# Patient Record
Sex: Female | Born: 1995 | Race: Black or African American | Hispanic: No | Marital: Single | Smoking: Never smoker
Health system: Southern US, Community
[De-identification: ages and names within clinical notes are randomized; demographics above are authoritative.]

## PROBLEM LIST (undated history)

## (undated) ENCOUNTER — Inpatient Hospital Stay (HOSPITAL_COMMUNITY): Payer: Self-pay

## (undated) DIAGNOSIS — O98813 Other maternal infectious and parasitic diseases complicating pregnancy, third trimester: Secondary | ICD-10-CM

## (undated) DIAGNOSIS — K2970 Gastritis, unspecified, without bleeding: Secondary | ICD-10-CM

## (undated) DIAGNOSIS — O24419 Gestational diabetes mellitus in pregnancy, unspecified control: Secondary | ICD-10-CM

## (undated) DIAGNOSIS — A749 Chlamydial infection, unspecified: Secondary | ICD-10-CM

## (undated) HISTORY — DX: Gestational diabetes mellitus in pregnancy, unspecified control: O24.419

## (undated) HISTORY — PX: HERNIA REPAIR: SHX51

---
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
## 2016-02-11 LAB — OB RESULTS CONSOLE GC/CHLAMYDIA: CHLAMYDIA, DNA PROBE: POSITIVE

## 2016-02-19 ENCOUNTER — Other Ambulatory Visit (HOSPITAL_COMMUNITY): Payer: Self-pay | Admitting: Obstetrics and Gynecology

## 2016-02-19 DIAGNOSIS — Z3A38 38 weeks gestation of pregnancy: Secondary | ICD-10-CM

## 2016-02-19 DIAGNOSIS — Z3689 Encounter for other specified antenatal screening: Secondary | ICD-10-CM

## 2016-02-24 ENCOUNTER — Ambulatory Visit (HOSPITAL_COMMUNITY)
Admission: RE | Admit: 2016-02-24 | Discharge: 2016-02-24 | Disposition: A | Discharge status: Home / Self Care | Payer: 59 | Source: Ambulatory Visit | Attending: Obstetrics and Gynecology | Admitting: Obstetrics and Gynecology

## 2016-02-24 DIAGNOSIS — Z3A38 38 weeks gestation of pregnancy: Secondary | ICD-10-CM | POA: Insufficient documentation

## 2016-02-24 DIAGNOSIS — Z3689 Encounter for other specified antenatal screening: Secondary | ICD-10-CM

## 2016-02-24 DIAGNOSIS — O26843 Uterine size-date discrepancy, third trimester: Secondary | ICD-10-CM | POA: Insufficient documentation

## 2016-02-29 NOTE — L&D Delivery Note (Signed)
Delivery Note Pt reached compete dilation and pushed well for about 15 minutes. At 6:34 PM a healthy female was delivered via Vaginal, Spontaneous Delivery (Presentation: LOA  ).  APGAR: 9, 9; weight pending.   Placenta status: delivered spontaneously .  Cord:  with the following complications: none .    Anesthesia: Epidural  Episiotomy: None Lacerations: 1st degree repaired for hemostasis and small periurethral Suture Repair: 3.0 vicryl rapide Est. Blood Loss (mL): 175mL  Mom to postpartum.  Baby to Couplet care / Skin to Skin.  Urine sent for GC/Chlam D/w pt, FOB and pt's mother circumcision.  The patient states she has not yet paid for the procedure.  We discussed office circumcision and hospital circumcision and they will likely proceed in office.  Aware payment must be made at either place before we can proceed.  Oliver PilaRICHARDSON,Ingra Rother W 03/03/2016, 6:58 PM

## 2016-03-03 ENCOUNTER — Inpatient Hospital Stay (HOSPITAL_COMMUNITY)
Admission: AD | Admit: 2016-03-03 | Discharge: 2016-03-05 | DRG: 775 | Disposition: A | Discharge status: Home / Self Care | Payer: 59 | Source: Ambulatory Visit | Attending: Obstetrics and Gynecology | Admitting: Obstetrics and Gynecology

## 2016-03-03 ENCOUNTER — Inpatient Hospital Stay (HOSPITAL_COMMUNITY): Payer: 59 | Admitting: Anesthesiology

## 2016-03-03 ENCOUNTER — Encounter (HOSPITAL_COMMUNITY): Payer: Self-pay

## 2016-03-03 DIAGNOSIS — Z3A39 39 weeks gestation of pregnancy: Secondary | ICD-10-CM

## 2016-03-03 DIAGNOSIS — O99824 Streptococcus B carrier state complicating childbirth: Secondary | ICD-10-CM | POA: Diagnosis present

## 2016-03-03 DIAGNOSIS — Z3493 Encounter for supervision of normal pregnancy, unspecified, third trimester: Secondary | ICD-10-CM | POA: Diagnosis present

## 2016-03-03 HISTORY — DX: Other maternal infectious and parasitic diseases complicating pregnancy, third trimester: O98.813

## 2016-03-03 HISTORY — DX: Chlamydial infection, unspecified: A74.9

## 2016-03-03 LAB — CBC
HEMATOCRIT: 36.9 % (ref 36.0–46.0)
HEMOGLOBIN: 13.1 g/dL (ref 12.0–15.0)
MCH: 29.7 pg (ref 26.0–34.0)
MCHC: 35.5 g/dL (ref 30.0–36.0)
MCV: 83.7 fL (ref 78.0–100.0)
Platelets: 178 10*3/uL (ref 150–400)
RBC: 4.41 MIL/uL (ref 3.87–5.11)
RDW: 13.4 % (ref 11.5–15.5)
WBC: 14.2 10*3/uL — ABNORMAL HIGH (ref 4.0–10.5)

## 2016-03-03 LAB — OB RESULTS CONSOLE ANTIBODY SCREEN: ANTIBODY SCREEN: NEGATIVE

## 2016-03-03 LAB — OB RESULTS CONSOLE RPR: RPR: NONREACTIVE

## 2016-03-03 LAB — OB RESULTS CONSOLE HEPATITIS B SURFACE ANTIGEN: Hepatitis B Surface Ag: NEGATIVE

## 2016-03-03 LAB — OB RESULTS CONSOLE RUBELLA ANTIBODY, IGM: Rubella: IMMUNE

## 2016-03-03 LAB — OB RESULTS CONSOLE GC/CHLAMYDIA
Chlamydia: NEGATIVE
Gonorrhea: NEGATIVE

## 2016-03-03 LAB — OB RESULTS CONSOLE GBS: STREP GROUP B AG: POSITIVE

## 2016-03-03 LAB — OB RESULTS CONSOLE ABO/RH: RH Type: POSITIVE

## 2016-03-03 LAB — TYPE AND SCREEN
ABO/RH(D): O POS
Antibody Screen: NEGATIVE

## 2016-03-03 LAB — OB RESULTS CONSOLE HIV ANTIBODY (ROUTINE TESTING): HIV: NONREACTIVE

## 2016-03-03 LAB — ABO/RH: ABO/RH(D): O POS

## 2016-03-03 MED ORDER — DIBUCAINE 1 % RE OINT: 1.0000 "application " | TOPICAL_OINTMENT | RECTAL | Status: DC | PRN

## 2016-03-03 MED ORDER — SODIUM CHLORIDE 0.9 % IV SOLN
2.0000 g | Freq: Once | INTRAVENOUS | Status: AC
  Administered 2016-03-03: 2 g via INTRAVENOUS
  Filled 2016-03-03: qty 2000

## 2016-03-03 MED ORDER — BENZOCAINE-MENTHOL 20-0.5 % EX AERO
1.0000 "application " | INHALATION_SPRAY | CUTANEOUS | Status: DC | PRN
  Administered 2016-03-04: 1 via TOPICAL
  Filled 2016-03-03: qty 56

## 2016-03-03 MED ORDER — OXYTOCIN BOLUS FROM INFUSION
500.0000 mL | Freq: Once | INTRAVENOUS | Status: AC
  Administered 2016-03-03: 500 mL via INTRAVENOUS

## 2016-03-03 MED ORDER — SOD CITRATE-CITRIC ACID 500-334 MG/5ML PO SOLN: 30.0000 mL | ORAL | Status: DC | PRN

## 2016-03-03 MED ORDER — LIDOCAINE HCL (PF) 1 % IJ SOLN
30.0000 mL | INTRAMUSCULAR | Status: DC | PRN
  Filled 2016-03-03: qty 30

## 2016-03-03 MED ORDER — IBUPROFEN 600 MG PO TABS
600.0000 mg | ORAL_TABLET | Freq: Four times a day (QID) | ORAL | Status: DC
  Administered 2016-03-04 – 2016-03-05 (×7): 600 mg via ORAL
  Filled 2016-03-03 (×7): qty 1

## 2016-03-03 MED ORDER — PHENYLEPHRINE 40 MCG/ML (10ML) SYRINGE FOR IV PUSH (FOR BLOOD PRESSURE SUPPORT): 80.0000 ug | PREFILLED_SYRINGE | INTRAVENOUS | Status: DC | PRN

## 2016-03-03 MED ORDER — EPHEDRINE 5 MG/ML INJ
10.0000 mg | INTRAVENOUS | Status: DC | PRN
  Filled 2016-03-03: qty 4

## 2016-03-03 MED ORDER — LACTATED RINGERS IV SOLN: 500.0000 mL | Freq: Once | INTRAVENOUS | Status: DC

## 2016-03-03 MED ORDER — PHENYLEPHRINE 40 MCG/ML (10ML) SYRINGE FOR IV PUSH (FOR BLOOD PRESSURE SUPPORT)
80.0000 ug | PREFILLED_SYRINGE | INTRAVENOUS | Status: DC | PRN
  Filled 2016-03-03: qty 5

## 2016-03-03 MED ORDER — FENTANYL 2.5 MCG/ML BUPIVACAINE 1/10 % EPIDURAL INFUSION (WH - ANES)
14.0000 mL/h | INTRAMUSCULAR | Status: DC | PRN
  Administered 2016-03-03: 14 mL/h via EPIDURAL

## 2016-03-03 MED ORDER — ZOLPIDEM TARTRATE 5 MG PO TABS: 5.0000 mg | ORAL_TABLET | Freq: Every evening | ORAL | Status: DC | PRN

## 2016-03-03 MED ORDER — SIMETHICONE 80 MG PO CHEW: 80.0000 mg | CHEWABLE_TABLET | ORAL | Status: DC | PRN

## 2016-03-03 MED ORDER — ACETAMINOPHEN 325 MG PO TABS
650.0000 mg | ORAL_TABLET | ORAL | Status: DC | PRN
  Administered 2016-03-04: 650 mg via ORAL
  Filled 2016-03-03: qty 2

## 2016-03-03 MED ORDER — ONDANSETRON HCL 4 MG PO TABS: 4.0000 mg | ORAL_TABLET | ORAL | Status: DC | PRN

## 2016-03-03 MED ORDER — LACTATED RINGERS IV SOLN
500.0000 mL | Freq: Once | INTRAVENOUS | Status: AC
  Administered 2016-03-03: 500 mL via INTRAVENOUS

## 2016-03-03 MED ORDER — OXYTOCIN 40 UNITS IN LACTATED RINGERS INFUSION - SIMPLE MED
2.5000 [IU]/h | INTRAVENOUS | Status: DC
  Administered 2016-03-03: 2.5 [IU]/h via INTRAVENOUS
  Filled 2016-03-03: qty 1000

## 2016-03-03 MED ORDER — ONDANSETRON HCL 4 MG/2ML IJ SOLN: 4.0000 mg | INTRAMUSCULAR | Status: DC | PRN

## 2016-03-03 MED ORDER — DIPHENHYDRAMINE HCL 25 MG PO CAPS: 25.0000 mg | ORAL_CAPSULE | Freq: Four times a day (QID) | ORAL | Status: DC | PRN

## 2016-03-03 MED ORDER — PHENYLEPHRINE 40 MCG/ML (10ML) SYRINGE FOR IV PUSH (FOR BLOOD PRESSURE SUPPORT)
PREFILLED_SYRINGE | INTRAVENOUS | Status: DC
Start: 2016-03-03 — End: 2016-03-03
  Filled 2016-03-03: qty 20

## 2016-03-03 MED ORDER — COCONUT OIL OIL: 1.0000 "application " | TOPICAL_OIL | Status: DC | PRN

## 2016-03-03 MED ORDER — SENNOSIDES-DOCUSATE SODIUM 8.6-50 MG PO TABS
2.0000 | ORAL_TABLET | ORAL | Status: DC
  Administered 2016-03-04 – 2016-03-05 (×2): 2 via ORAL
  Filled 2016-03-03 (×2): qty 2

## 2016-03-03 MED ORDER — LACTATED RINGERS IV SOLN
INTRAVENOUS | Status: DC
  Administered 2016-03-03: 16:00:00 via INTRAVENOUS

## 2016-03-03 MED ORDER — TETANUS-DIPHTH-ACELL PERTUSSIS 5-2.5-18.5 LF-MCG/0.5 IM SUSP: 0.5000 mL | Freq: Once | INTRAMUSCULAR | Status: DC

## 2016-03-03 MED ORDER — OXYCODONE-ACETAMINOPHEN 5-325 MG PO TABS: 1.0000 | ORAL_TABLET | ORAL | Status: DC | PRN

## 2016-03-03 MED ORDER — LACTATED RINGERS IV SOLN
500.0000 mL | INTRAVENOUS | Status: DC | PRN
  Administered 2016-03-03: 500 mL via INTRAVENOUS

## 2016-03-03 MED ORDER — ACETAMINOPHEN 325 MG PO TABS: 650.0000 mg | ORAL_TABLET | ORAL | Status: DC | PRN

## 2016-03-03 MED ORDER — ONDANSETRON HCL 4 MG/2ML IJ SOLN: 4.0000 mg | Freq: Four times a day (QID) | INTRAMUSCULAR | Status: DC | PRN

## 2016-03-03 MED ORDER — LIDOCAINE HCL (PF) 1 % IJ SOLN
INTRAMUSCULAR | Status: DC | PRN
  Administered 2016-03-03: 4 mL via EPIDURAL

## 2016-03-03 MED ORDER — FLEET ENEMA 7-19 GM/118ML RE ENEM: 1.0000 | ENEMA | RECTAL | Status: DC | PRN

## 2016-03-03 MED ORDER — OXYCODONE-ACETAMINOPHEN 5-325 MG PO TABS: 2.0000 | ORAL_TABLET | ORAL | Status: DC | PRN

## 2016-03-03 MED ORDER — EPHEDRINE 5 MG/ML INJ: 10.0000 mg | INTRAVENOUS | Status: DC | PRN

## 2016-03-03 MED ORDER — DIPHENHYDRAMINE HCL 50 MG/ML IJ SOLN: 12.5000 mg | INTRAMUSCULAR | Status: DC | PRN

## 2016-03-03 MED ORDER — WITCH HAZEL-GLYCERIN EX PADS: 1.0000 "application " | MEDICATED_PAD | CUTANEOUS | Status: DC | PRN

## 2016-03-03 MED ORDER — FENTANYL 2.5 MCG/ML BUPIVACAINE 1/10 % EPIDURAL INFUSION (WH - ANES)
INTRAMUSCULAR | Status: AC
  Filled 2016-03-03: qty 100

## 2016-03-03 MED ORDER — PRENATAL MULTIVITAMIN CH
1.0000 | ORAL_TABLET | Freq: Every day | ORAL | Status: DC
  Administered 2016-03-04 – 2016-03-05 (×2): 1 via ORAL
  Filled 2016-03-03 (×2): qty 1

## 2016-03-03 NOTE — H&P (Signed)
Erica Horton is a 21 y.o. female G1P0 at 8639 5/7 weeks (EDD1/6/18) by 24 week US)  presenting for active labor at erm.  Cervix 6cm on admission, received ampicillin for +GBS and epidural and now 9+cm within 2 hours of admission. Prenatal care complicated by late start at 26 weeks, pt stated she did not know pregnant.  Also, initially chlamydia was negative 10/17 but positive 02/05/16, did not pick up script and take until 02/18/16 so has not had TOC.  States took all medication and partner did as well.   Had a fundal lag, but US at 35 weeks was 30%le and AFI WNL.    OB History    Gravida Para Term Preterm AB Living   1 0 0 0 0 0   SAB TAB Ectopic Multiple Live Births                 Past Medical History:  Diagnosis Date  . Chlamydia infection affecting pregnancy in third trimester    Past Surgical History:  Procedure Laterality Date  . HERNIA REPAIR     Family History: family history is not on file. Social History:  reports that she has never smoked. She has never used smokeless tobacco. She reports that she does not drink alcohol or use drugs.     Maternal Diabetes: No Genetic Screening: late to care Maternal Ultrasounds/Referrals: Normal Fetal Ultrasounds or other Referrals:  None Maternal Substance Abuse:  No Significant Maternal Medications:  None Significant Maternal Lab Results:  Lab values include: Group B Strep positive and chlamydia positive 02/05/16 Other Comments:  None  Review of Systems  Constitutional: Negative for fever.  Gastrointestinal: Positive for abdominal pain.   Maternal Medical History:  Reason for admission: Contractions.   Contractions: Onset was 6-12 hours ago.   Frequency: regular.   Perceived severity is strong.    Fetal activity: Perceived fetal activity is normal.    Prenatal Complications - Diabetes: none.   AROM clear Dilation: Lip/rim Effacement (%): 100 Station: 0 Exam by:: Dr. Senaida Oresichardson Blood pressure 126/70, pulse 75,  temperature 97.3 F (36.3 C), temperature source Oral, resp. rate 16, height 5\' 11"  (1.803 m), weight 86.2 kg (190 lb), SpO2 100 %. Maternal Exam:  Uterine Assessment: Contraction strength is firm.  Contraction frequency is regular.   Abdomen: Patient reports no abdominal tenderness. Fetal presentation: vertex  Introitus: Normal vulva. Normal vagina.    Physical Exam  Constitutional: She appears well-developed and well-nourished.  Cardiovascular: Normal rate and regular rhythm.   Respiratory: Effort normal.  GI: Soft.  Genitourinary: Vagina normal and uterus normal.  Neurological: She is alert.  Psychiatric: She has a normal mood and affect.    Prenatal labs: ABO, Rh: O/Positive/-- (01/04 1607) Antibody: Negative (01/04 1607) Rubella: Immune (01/04 1607) RPR: Nonreactive (01/04 1607)  HBsAg: Negative (01/04 1607)  HIV: Non-reactive (01/04 1607)  GBS: Positive (01/04 1606)  One hour GCT 84 Hgb AA  Assessment/Plan: Pt progressing rapidly in labor s/p one dose ampicillin Pt with +chlamydia 02/05/16, treated 02/18/16 and no TOC yet, will send urine for GC/Chlam and see if cleared, if positive may just have not been long enough from Rx.   Oliver PilaICHARDSON,Liani Caris W 03/03/2016, 5:35 PM

## 2016-03-03 NOTE — Anesthesia Preprocedure Evaluation (Signed)
Anesthesia Evaluation  Patient identified by MRN, date of birth, ID band Patient awake    Reviewed: Allergy & Precautions, NPO status , Patient's Chart, lab work & pertinent test results  Airway Mallampati: I  TM Distance: >3 FB     Dental  (+) Teeth Intact, Dental Advisory Given   Pulmonary neg pulmonary ROS,    breath sounds clear to auscultation       Cardiovascular negative cardio ROS   Rhythm:Regular Rate:Normal     Neuro/Psych negative neurological ROS  negative psych ROS   GI/Hepatic negative GI ROS, Neg liver ROS,   Endo/Other  negative endocrine ROS  Renal/GU negative Renal ROS  negative genitourinary   Musculoskeletal negative musculoskeletal ROS (+)   Abdominal   Peds negative pediatric ROS (+)  Hematology negative hematology ROS (+)   Anesthesia Other Findings   Reproductive/Obstetrics (+) Pregnancy                             Anesthesia Physical Anesthesia Plan  ASA: II  Anesthesia Plan: Epidural   Post-op Pain Management:    Induction:   Airway Management Planned:   Additional Equipment:   Intra-op Plan:   Post-operative Plan:   Informed Consent: I have reviewed the patients History and Physical, chart, labs and discussed the procedure including the risks, benefits and alternatives for the proposed anesthesia with the patient or authorized representative who has indicated his/her understanding and acceptance.     Plan Discussed with:   Anesthesia Plan Comments:         Anesthesia Quick Evaluation

## 2016-03-03 NOTE — Anesthesia Pain Management Evaluation Note (Signed)
  CRNA Pain Management Visit Note  Patient: Erica Horton, 21 y.o., female  "Hello I am a member of the anesthesia team at [facility]. We have an anesthesia team available at all times to provide care throughout the hospital, including epidural management and anesthesia for C-section. I don't know your plan for the delivery whether it a natural birth, water birth, IV sedation, nitrous supplementation, doula or epidural, but we want to meet your pain goals."   1.Was your pain managed to your expectations on prior hospitalizations?   No prior hospitalizations  2.What is your expectation for pain management during this hospitalization?     Epidural  3.How can we help you reach that goal?   Record the patient's initial score and the patient's pain goal.   Pain: 9  Pain Goal: 3 [facility] wants you to be able to say your pain was always managed very well.  Erica Horton,Erica Horton 03/03/2016

## 2016-03-03 NOTE — Anesthesia Procedure Notes (Signed)
Epidural Patient location during procedure: OB Start time: 03/03/2016 4:19 PM End time: 03/03/2016 4:25 PM  Staffing Anesthesiologist: Shona SimpsonHOLLIS, Maie Kesinger D Performed: anesthesiologist   Preanesthetic Checklist Completed: patient identified, site marked, surgical consent, pre-op evaluation, timeout performed, IV checked, risks and benefits discussed and monitors and equipment checked  Epidural Patient position: sitting Prep: ChloraPrep Patient monitoring: heart rate, continuous pulse ox and blood pressure Approach: midline Location: L3-L4 Injection technique: LOR saline  Needle:  Needle type: Tuohy  Needle gauge: 17 G Needle length: 9 cm Catheter type: closed end flexible Catheter size: 20 Guage Test dose: negative and 1.5% lidocaine  Assessment Events: blood not aspirated, injection not painful, no injection resistance and no paresthesia  Additional Notes LOR @ 5  Patient identified. Risks/Benefits/Options discussed with patient including but not limited to bleeding, infection, nerve damage, paralysis, failed block, incomplete pain control, headache, blood pressure changes, nausea, vomiting, reactions to medications, itching and postpartum back pain. Confirmed with bedside nurse the patient's most recent platelet count. Confirmed with patient that they are not currently taking any anticoagulation, have any bleeding history or any family history of bleeding disorders. Patient expressed understanding and wished to proceed. All questions were answered. Sterile technique was used throughout the entire procedure. Please see nursing notes for vital signs. Test dose was given through epidural catheter and negative prior to continuing to dose epidural or start infusion. Warning signs of high block given to the patient including shortness of breath, tingling/numbness in hands, complete motor block, or any concerning symptoms with instructions to call for help. Patient was given instructions on fall  risk and not to get out of bed. All questions and concerns addressed with instructions to call with any issues or inadequate analgesia.    Reason for block:procedure for pain

## 2016-03-03 NOTE — MAU Note (Signed)
Pt having ctx about 4.5 minutes apart since this morning.

## 2016-03-04 LAB — CBC
HCT: 31.7 % — ABNORMAL LOW (ref 36.0–46.0)
Hemoglobin: 11.1 g/dL — ABNORMAL LOW (ref 12.0–15.0)
MCH: 29.6 pg (ref 26.0–34.0)
MCHC: 35 g/dL (ref 30.0–36.0)
MCV: 84.5 fL (ref 78.0–100.0)
PLATELETS: 172 10*3/uL (ref 150–400)
RBC: 3.75 MIL/uL — ABNORMAL LOW (ref 3.87–5.11)
RDW: 13.4 % (ref 11.5–15.5)
WBC: 16.5 10*3/uL — ABNORMAL HIGH (ref 4.0–10.5)

## 2016-03-04 LAB — RPR
RPR Ser Ql: NONREACTIVE
RPR: NONREACTIVE

## 2016-03-04 LAB — HIV ANTIBODY (ROUTINE TESTING W REFLEX): HIV SCREEN 4TH GENERATION: NONREACTIVE

## 2016-03-04 NOTE — Anesthesia Postprocedure Evaluation (Signed)
Anesthesia Post Note  Patient: Erica Horton  Procedure(s) Performed: * No procedures listed *  Patient location during evaluation: Mother Baby Anesthesia Type: Epidural Level of consciousness: awake and alert Pain management: pain level controlled Vital Signs Assessment: post-procedure vital signs reviewed and stable Respiratory status: spontaneous breathing, nonlabored ventilation and respiratory function stable Cardiovascular status: stable Postop Assessment: no headache, no backache and epidural receding Anesthetic complications: no        Last Vitals:  Vitals:   03/04/16 0107 03/04/16 0820  BP: (!) 132/95 (!) 107/56  Pulse: 74 74  Resp: 14 16  Temp: 36.7 C 36.7 C    Last Pain:  Vitals:   03/04/16 0107  TempSrc: Oral  PainSc: 5    Pain Goal: Patients Stated Pain Goal: 4 (03/03/16 1548)               Rica RecordsICKELTON,Khush Pasion

## 2016-03-04 NOTE — Lactation Note (Signed)
This note was copied from a baby's chart. Lactation Consultation Note New mom sleeping w/baby STS w/face towards chest. Woke mom up and placed baby in crib for safety if mom wanted to sleep. Mom stated she did want to sleep. Mom stated he wants someone to hold him all the time. Educated on newborn behavior and feeding habits. Discussed STS, I&O, cluster feeding, supply and demand.  Mom has WIC and took BF classes there.  Mom demonstrated hand expression. No colostrum noted at this time. Mom has long pendulum breast w/everted nipple at the end of breast.  Mom encouraged to feed baby 8-12 times/24 hours and with feeding cues. Mom encouraged to waken baby for feeds.  Encouraged to call staff for assistance or for questions. WH/LC brochure given w/resources, support groups and LC services. Patient Name: Erica Horton Reason for consult: Initial assessment   Maternal Data Has patient been taught Hand Expression?: Yes Does the patient have breastfeeding experience prior to this delivery?: No  Feeding Feeding Type: Breast Fed Length of feed: 10 min  LATCH Score/Interventions       Type of Nipple: Everted at rest and after stimulation  Comfort (Breast/Nipple): Soft / non-tender     Intervention(s): Breastfeeding basics reviewed;Support Pillows;Position options;Skin to skin     Lactation Tools Discussed/Used WIC Program: Yes   Consult Status Consult Status: Follow-up Date: 03/04/16 (in pm) Follow-up type: In-patient    Charyl DancerCARVER, Maysel Mccolm G Horton, 3:19 AM

## 2016-03-04 NOTE — Progress Notes (Signed)
PPD #1 No problems Afeb, VSS Fundus firm, NT at U-1 Continue routine postpartum care 

## 2016-03-05 MED ORDER — IBUPROFEN 600 MG PO TABS: 600.0000 mg | ORAL_TABLET | Freq: Four times a day (QID) | ORAL | 0 refills | Status: DC

## 2016-03-05 NOTE — Discharge Summary (Signed)
    OB Discharge Summary     Patient Name: Erica Horton DOB: 03/08/1995 MRN: [id number]  Date of admission: 03/03/2016 Delivering MD: Huel CoteICHARDSON, KATHY   Date of discharge: 03/05/2016  Admitting diagnosis: 40w ctx 4 min, pressure Intrauterine pregnancy: 8671w5d     Secondary diagnosis:  Active Problems:   Indication for care or intervention related to labor and delivery   NSVD (normal spontaneous vaginal delivery)      Discharge diagnosis: Term Pregnancy Delivered                                    Hospital course:  Onset of Labor With Vaginal Delivery     21 y.o. yo G1P1001 at 4571w5d was admitted in Active Labor on 03/03/2016. Patient had an uncomplicated labor course as follows:  Membrane Rupture Time/Date: 5:22 PM ,03/03/2016   Intrapartum Procedures: Episiotomy: None [1]                                         Lacerations:  1st degree [2]  Patient had a delivery of a Viable infant. 03/03/2016  Information for the patient's newborn:  Bland SpanBowks, Boy Shenicka [[id number]][[id number]]  Delivery Method: Vag-Spont    Pateint had an uncomplicated postpartum course.  She is ambulating, tolerating a regular diet, passing flatus, and urinating well. Patient is discharged home in stable condition on 03/05/16.    Physical exam Vitals:   03/04/16 0107 03/04/16 0820 03/04/16 1733 03/05/16 0545  BP: (!) 132/95 (!) 107/56 (!) 110/50 112/61  Pulse: 74 74 79 73  Resp: 14 16 18 18   Temp: 98.1 F (36.7 C) 98.1 F (36.7 C) 97.6 F (36.4 C) 98.4 F (36.9 C)  TempSrc: Oral  Oral Oral  SpO2: 99%  99%   Weight:      Height:       General: alert Lochia: appropriate Uterine Fundus: firm  Labs: Lab Results  Component Value Date   WBC 16.5 (H) 03/04/2016   HGB 11.1 (L) 03/04/2016   HCT 31.7 (L) 03/04/2016   MCV 84.5 03/04/2016   PLT 172 03/04/2016   No flowsheet data found.  Discharge instruction: per After Visit Summary and "Baby and Me Booklet".  After visit meds:  Allergies as of 03/05/2016   No  Known Allergies     Medication List    TAKE these medications   ibuprofen 600 MG tablet Commonly known as:  ADVIL,MOTRIN Take 1 tablet (600 mg total) by mouth every 6 (six) hours. What changed:  medication strength  how much to take  when to take this  reasons to take this   prenatal multivitamin Tabs tablet Take 1 tablet by mouth daily at 12 noon.       Diet: routine diet  Activity: Advance as tolerated. Pelvic rest for 6 weeks.   Outpatient follow up:6 weeks  Newborn Data: Live born female  Birth Weight: 7 lb 8.1 oz (3405 g) APGAR: 9, 9  Baby Feeding: Breast Disposition:home with mother   03/05/2016 Zenaida NieceMEISINGER,Chavonne Sforza D, MD

## 2016-03-05 NOTE — Progress Notes (Signed)
PPD #2 Doing well Afeb, VSS D/c home 

## 2016-03-05 NOTE — Lactation Note (Signed)
This note was copied from a baby's chart. Lactation Consultation Note:   Mother states that breastfeeding is going good. She states she has a little difficulty latching infant on the left breast. Tips given to rotate positions and tickle infants top lip and bring infant on quickly.   Mother has been giving bottles. She states she is undecided how long she will breastfeed and she wants infant to be able to take a bottle.  Mother was given a harmony hand pump with instructions that she may  Pump and supplement with her own milk. Mother advised to do mostly breastfeeding now and an occational bottle.  Mother advised to feed infant 8-12 times in 24 hours and with feeding cues. Mother advised to do frequent STS and when feeding. Mother is aware of available Lactation services . Mother is active with WIC.  Patient Name: Erica Horton RUEAV'WToday's Date: 03/05/2016 Reason for consult: Follow-up assessment   Maternal Data    Feeding Feeding Type: Bottle Fed - Formula  LATCH Score/Interventions                      Lactation Tools Discussed/Used     Consult Status      Erica Horton, Erica Horton 03/05/2016, 12:50 PM

## 2016-03-05 NOTE — Discharge Instructions (Signed)
As per discharge pamphlet °

## 2016-03-07 LAB — GC/CHLAMYDIA PROBE AMP (~~LOC~~) NOT AT ARMC
CHLAMYDIA, DNA PROBE: POSITIVE — AB
Neisseria Gonorrhea: NEGATIVE

## 2016-03-11 ENCOUNTER — Inpatient Hospital Stay (HOSPITAL_COMMUNITY): Admission: RE | Admit: 2016-03-11 | Payer: 59 | Source: Ambulatory Visit

## 2016-07-03 IMAGING — CR DG KNEE COMPLETE 4+V*L*
4 series · 4 of 4 positions shown · non-contrast
Comparison: None.

CLINICAL DATA: Medial left knee pain.  No known injury.

EXAM:
LEFT KNEE - COMPLETE 4+ VIEW

[knee ap]
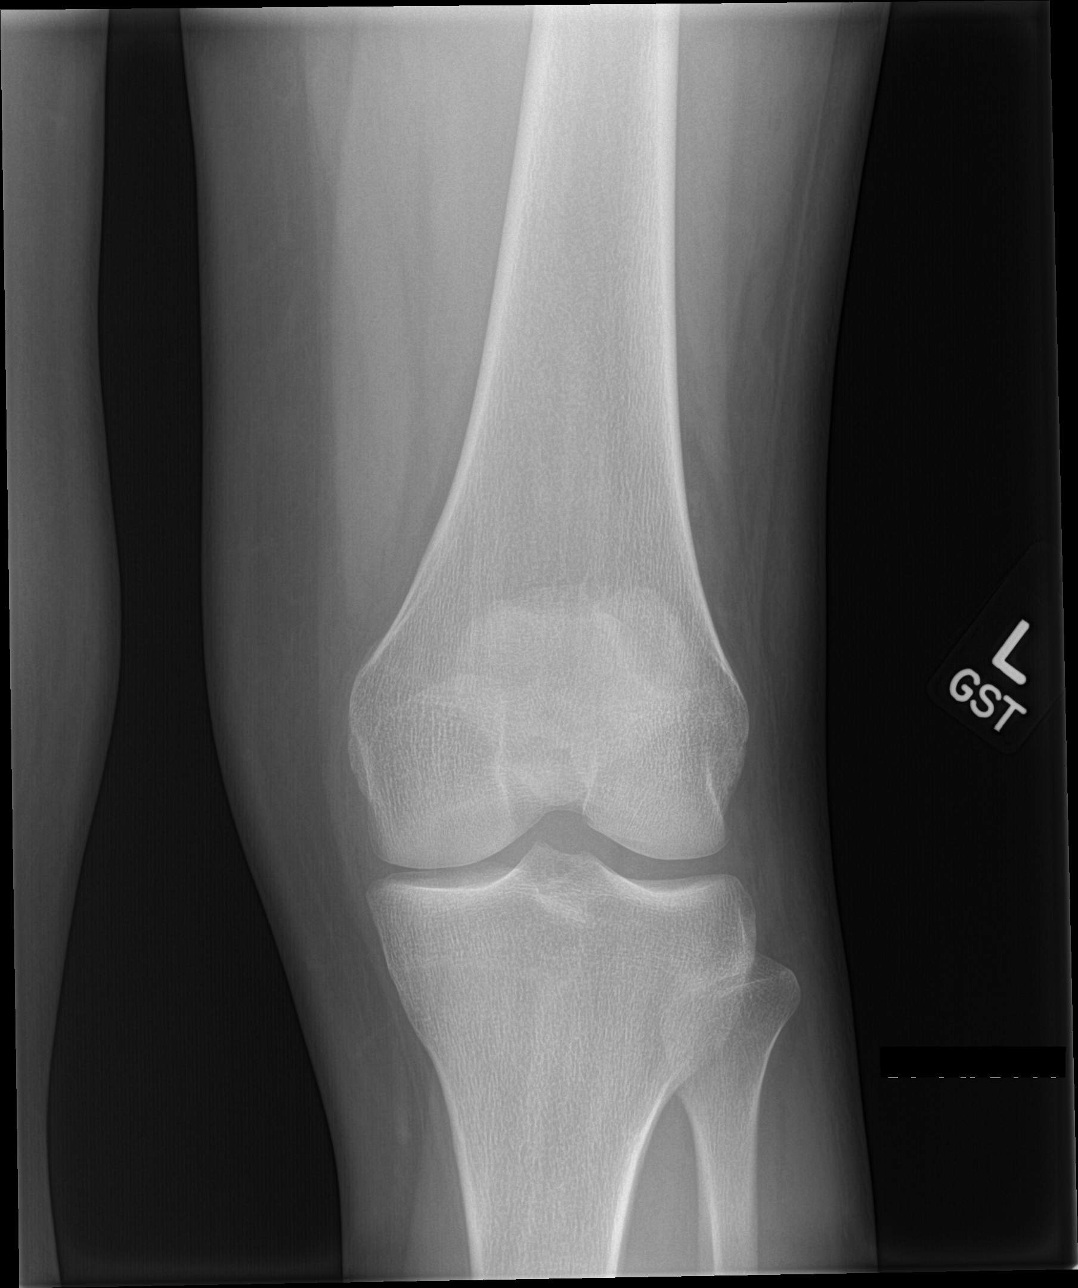

[tunnel]
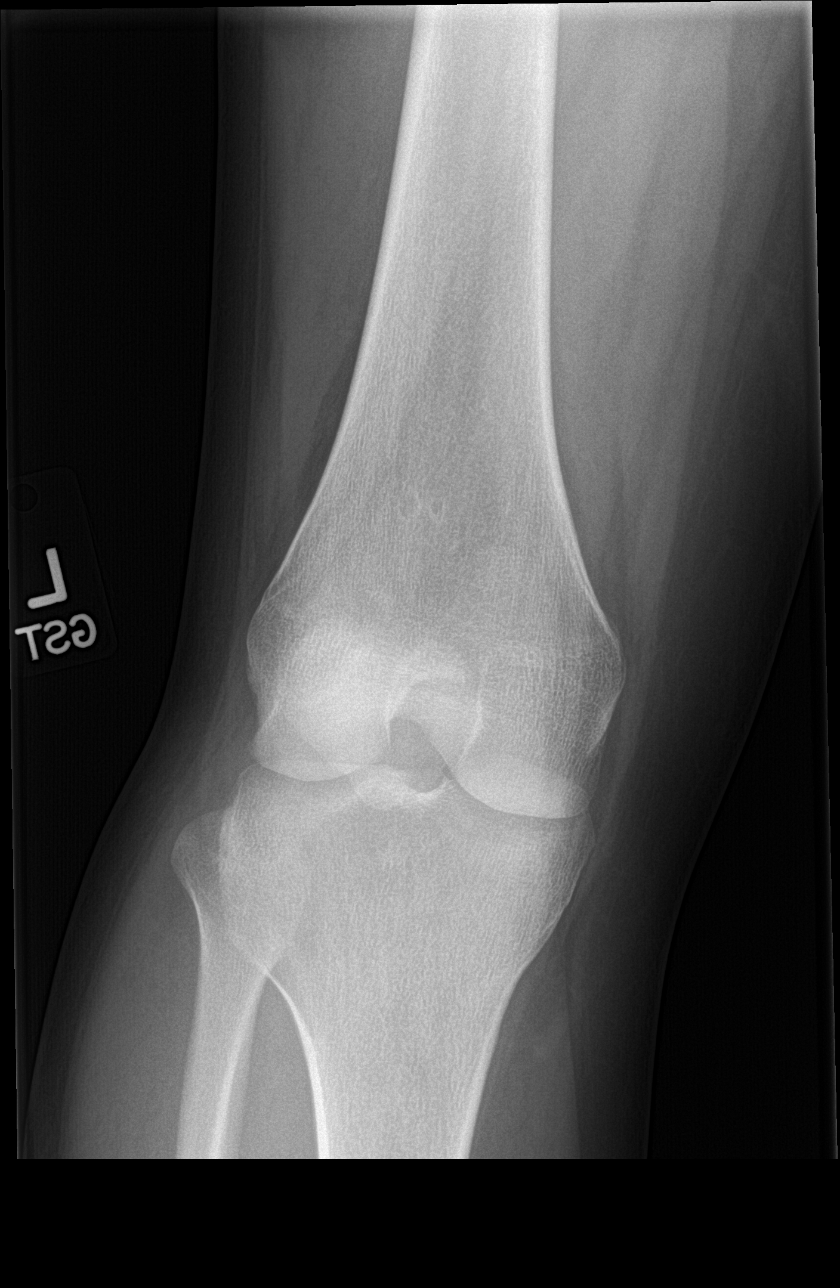

[knee lat]
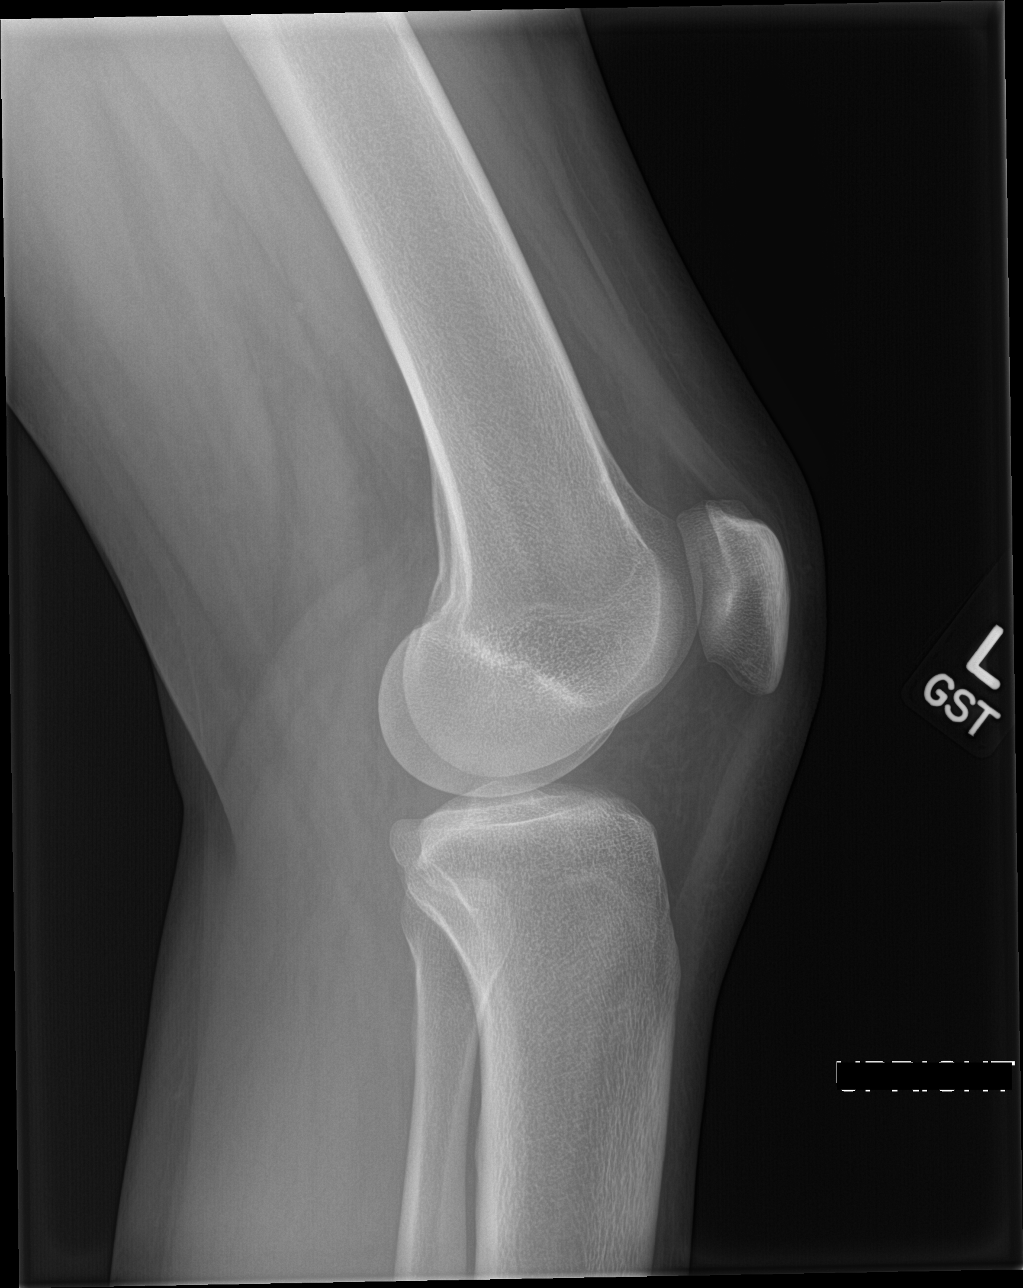

[knee sunrise]
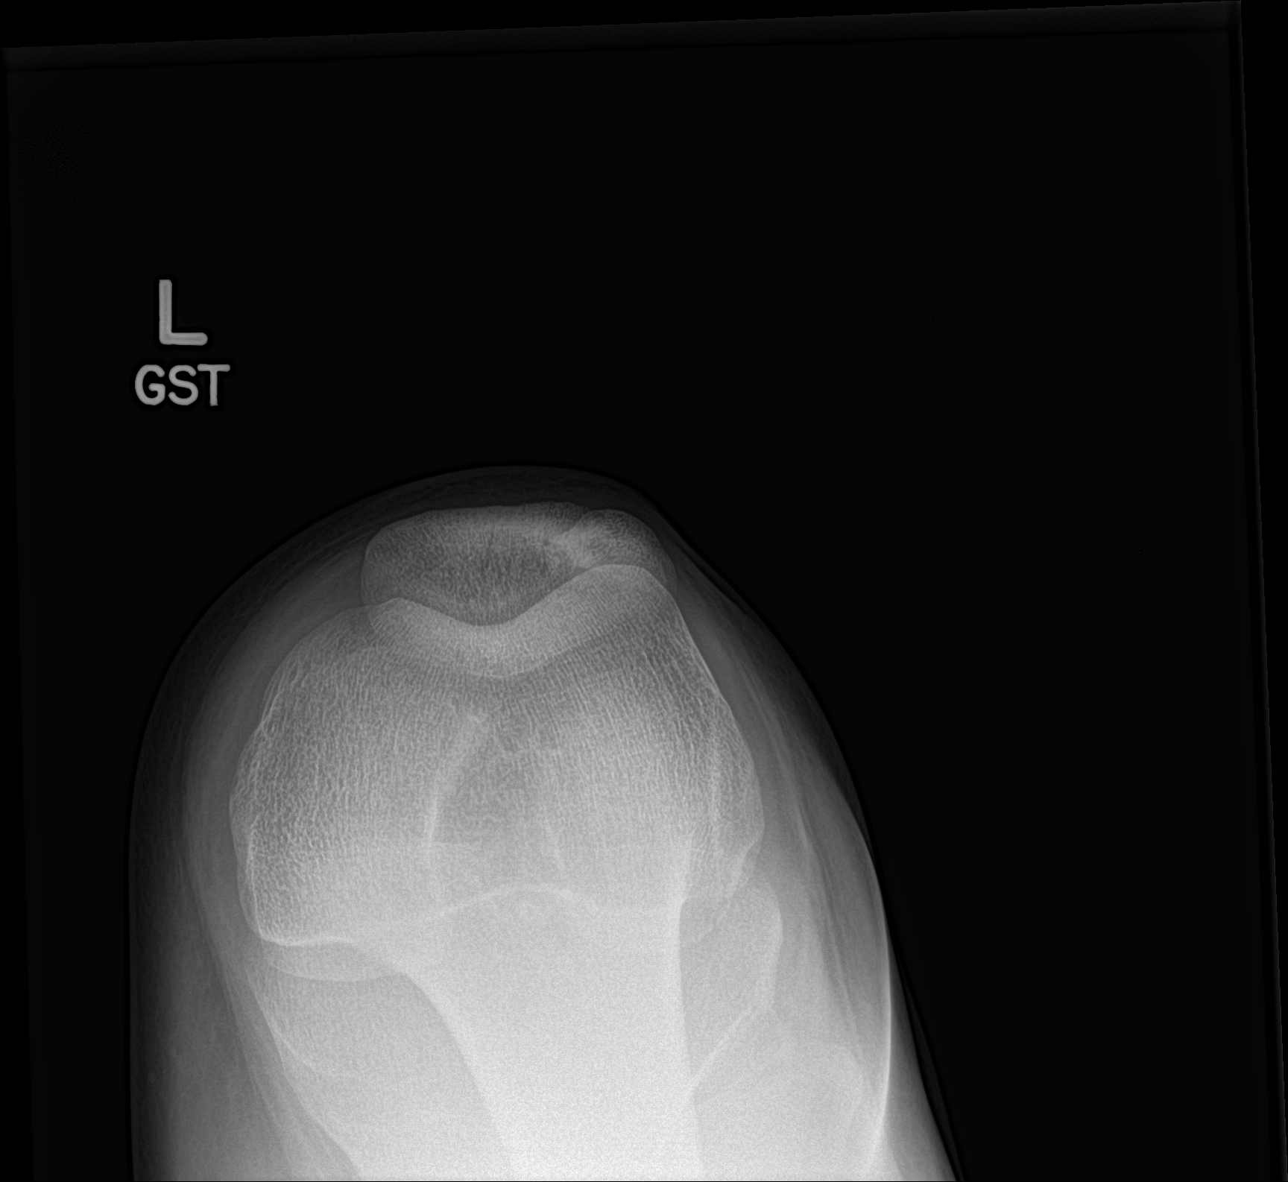

[4 of 4 positions shown; findings below may reference images not displayed]

FINDINGS: There is no evidence of fracture, dislocation, or joint effusion.
There is no evidence of arthropathy or other focal bone abnormality.
Soft tissues are unremarkable.
IMPRESSION: Negative.

## 2017-03-13 ENCOUNTER — Encounter (HOSPITAL_COMMUNITY): Payer: Self-pay

## 2017-03-13 ENCOUNTER — Other Ambulatory Visit: Payer: Self-pay

## 2017-03-13 ENCOUNTER — Emergency Department (HOSPITAL_COMMUNITY)
Admission: EM | Admit: 2017-03-13 | Discharge: 2017-03-13 | Disposition: A | Discharge status: Home / Self Care | Payer: 59 | Attending: Emergency Medicine | Admitting: Emergency Medicine

## 2017-03-13 ENCOUNTER — Emergency Department (HOSPITAL_COMMUNITY): Payer: 59

## 2017-03-13 DIAGNOSIS — Y999 Unspecified external cause status: Secondary | ICD-10-CM | POA: Diagnosis not present

## 2017-03-13 DIAGNOSIS — Y939 Activity, unspecified: Secondary | ICD-10-CM | POA: Insufficient documentation

## 2017-03-13 DIAGNOSIS — M25521 Pain in right elbow: Secondary | ICD-10-CM

## 2017-03-13 DIAGNOSIS — S50311A Abrasion of right elbow, initial encounter: Secondary | ICD-10-CM | POA: Insufficient documentation

## 2017-03-13 DIAGNOSIS — T148XXA Other injury of unspecified body region, initial encounter: Secondary | ICD-10-CM

## 2017-03-13 DIAGNOSIS — Y929 Unspecified place or not applicable: Secondary | ICD-10-CM | POA: Diagnosis not present

## 2017-03-13 DIAGNOSIS — Z79899 Other long term (current) drug therapy: Secondary | ICD-10-CM | POA: Diagnosis not present

## 2017-03-13 MED ORDER — NAPROXEN 500 MG PO TABS: 500.0000 mg | ORAL_TABLET | Freq: Two times a day (BID) | ORAL | 0 refills | Status: DC | PRN

## 2017-03-13 MED ORDER — BACITRACIN ZINC 500 UNIT/GM EX OINT
TOPICAL_OINTMENT | Freq: Once | CUTANEOUS | Status: AC
  Administered 2017-03-13: 23:00:00 via TOPICAL

## 2017-03-13 NOTE — ED Notes (Signed)
Patient Alert and oriented X4. Stable and ambulatory. Patient verbalized understanding of the discharge instructions.  Patient belongings were taken by the patient.  
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
## 2017-10-30 ENCOUNTER — Ambulatory Visit (HOSPITAL_COMMUNITY)
Admission: EM | Admit: 2017-10-30 | Discharge: 2017-10-30 | Disposition: A | Discharge status: Home / Self Care | Payer: 59 | Attending: Family Medicine | Admitting: Family Medicine

## 2017-10-30 ENCOUNTER — Encounter (HOSPITAL_COMMUNITY): Payer: Self-pay

## 2017-10-30 DIAGNOSIS — J069 Acute upper respiratory infection, unspecified: Secondary | ICD-10-CM | POA: Diagnosis not present

## 2017-10-30 DIAGNOSIS — B9789 Other viral agents as the cause of diseases classified elsewhere: Secondary | ICD-10-CM

## 2017-10-30 MED ORDER — CETIRIZINE HCL 5 MG PO TABS: 5.0000 mg | ORAL_TABLET | Freq: Every day | ORAL | 1 refills | Status: DC

## 2017-10-30 NOTE — Discharge Instructions (Signed)
It was nice meeting you!!  I believe this could be allergy related I will try Zyrtec over-the-counter to see if this helps her symptoms He can also continue the cough medication as needed. Follow up as needed for continued or worsening symptoms

## 2017-10-30 NOTE — ED Triage Notes (Signed)
Pt presents with persistent cough with no relief.

## 2017-10-30 NOTE — ED Provider Notes (Addendum)
[facility]    CSN: [id number] Arrival date & time: 10/30/17  7741     History   Chief Complaint Chief Complaint  Patient presents with  . Cough    HPI Erica Horton is a 22 y.o. female.   Is a healthy 22 year old female that presents with 2 days of mild cough, sore throat, itchy watery eyes, congestion.  Her symptoms have been constant but remain the same.  She has been taking over-the-counter meds for her symptoms.  She denies any fever, chills, body aches, fatigue, night sweats.  She denies any nausea, vomiting, diarrhea.  She denies any history of asthma or allergies.  She does not smoke     Past Medical History:  Diagnosis Date  . Chlamydia infection affecting pregnancy in third trimester     Patient Active Problem List   Diagnosis Date Noted  . Indication for care or intervention related to labor and delivery 03/03/2016  . NSVD (normal spontaneous vaginal delivery) 03/03/2016    Past Surgical History:  Procedure Laterality Date  . HERNIA REPAIR      OB History    Gravida  1   Para  1   Term  1   Preterm  0   AB  0   Living  1     SAB      TAB      Ectopic      Multiple  0   Live Births  1            Home Medications    Prior to Admission medications   Medication Sig Start Date End Date Taking? Authorizing Provider  cetirizine (ZYRTEC) 5 MG tablet Take 1 tablet (5 mg total) by mouth daily. 10/30/17   Dahlia Byes A, NP  ibuprofen (ADVIL,MOTRIN) 600 MG tablet Take 1 tablet (600 mg total) by mouth every 6 (six) hours. 03/05/16   Meisinger, Tawanna Cooler, MD  naproxen (NAPROSYN) 500 MG tablet Take 1 tablet (500 mg total) by mouth 2 (two) times daily as needed. 03/13/17   Ward, Chase Picket, PA-C  Prenatal Vit-Fe Fumarate-FA (PRENATAL MULTIVITAMIN) TABS tablet Take 1 tablet by mouth daily at 12 noon.    [provider]    Family History History reviewed. No pertinent family history.  Social History Social History    Tobacco Use  . Smoking status: Never Smoker  . Smokeless tobacco: Never Used  Substance Use Topics  . Alcohol use: No  . Drug use: No     Allergies   Coconut oil   Review of Systems Review of Systems  Constitutional: Negative for activity change, appetite change and fever.  HENT: Positive for congestion, ear discharge, rhinorrhea, sneezing and sore throat.   Respiratory: Positive for cough.   Cardiovascular: Negative for chest pain.  All other systems reviewed and are negative.    Physical Exam Triage Vital Signs ED Triage Vitals [10/30/17 1118]  Enc Vitals Group     BP (!) 90/54     Pulse Rate 84     Resp 20     Temp 98.8 F (37.1 C)     Temp Source Oral     SpO2 99 %     Weight      Height      Head Circumference      Peak Flow      Pain Score      Pain Loc      Pain Edu?  Excl. in GC?    No data found.  Updated Vital Signs BP (!) 90/54 (BP Location: Left Arm)   Pulse 84   Temp 98.8 F (37.1 C) (Oral)   Resp 20   LMP 10/27/2017   SpO2 99%   Visual Acuity Right Eye Distance:   Left Eye Distance:   Bilateral Distance:    Right Eye Near:   Left Eye Near:    Bilateral Near:     Physical Exam  Constitutional: She is oriented to person, place, and time. She appears well-developed and well-nourished.  Very pleasant.  Nontoxic or ill-appearing.  HENT:  Head: Normocephalic and atraumatic.  Bilateral TMs normal. Mild drainage to the posterior oropharynx without erythema or tonsillar swelling  Eyes: Conjunctivae are normal.  Neck: Normal range of motion.  Cardiovascular: Normal rate and regular rhythm.  Pulmonary/Chest: Effort normal and breath sounds normal.  Lungs clear in all fields.  No wheezing, rhonchi or dyspnea  Musculoskeletal: Normal range of motion.  Neurological: She is alert and oriented to person, place, and time.  Skin: Skin is warm and dry.  Psychiatric: She has a normal mood and affect.  Nursing note and vitals  reviewed.    UC Treatments / Results  Labs (all labs ordered are listed, but only abnormal results are displayed) Labs Reviewed - No data to display  EKG None  Radiology No results found.  Procedures Procedures (including critical care time)  Medications Ordered in UC Medications - No data to display  Initial Impression / Assessment and Plan / UC Course  I have reviewed the triage vital signs and the nursing notes.  Pertinent labs & imaging results that were available during my care of the patient were reviewed by me and considered in my medical decision making (see chart for details).     Viral URI vs allergies. Will try zyrtec for symptoms.  She can continue the OTC meds as needed.  Follow up as needed for continued or worsening symptoms   Final Clinical Impressions(s) / UC Diagnoses   Final diagnoses:  Viral URI with cough     Discharge Instructions     It was nice meeting you!!  I believe this could be allergy related I will try Zyrtec over-the-counter to see if this helps her symptoms He can also continue the cough medication as needed. Follow up as needed for continued or worsening symptoms     ED Prescriptions    Medication Sig Dispense Auth. Provider   cetirizine (ZYRTEC) 5 MG tablet Take 1 tablet (5 mg total) by mouth daily. 30 tablet Dahlia Byes A, NP     Controlled Substance Prescriptions [place] Controlled Substance Registry consulted? Not Applicable   Janace Aris, NP 10/31/17 1003    Dahlia Byes A, NP 10/31/17 1004

## 2018-10-07 ENCOUNTER — Other Ambulatory Visit: Payer: Self-pay

## 2018-10-07 ENCOUNTER — Encounter (HOSPITAL_COMMUNITY): Payer: Self-pay

## 2018-10-07 ENCOUNTER — Emergency Department (HOSPITAL_COMMUNITY)
Admission: EM | Admit: 2018-10-07 | Discharge: 2018-10-07 | Disposition: A | Discharge status: Home / Self Care | Payer: 59 | Attending: Emergency Medicine | Admitting: Emergency Medicine

## 2018-10-07 DIAGNOSIS — H5711 Ocular pain, right eye: Secondary | ICD-10-CM | POA: Diagnosis present

## 2018-10-07 DIAGNOSIS — Z79899 Other long term (current) drug therapy: Secondary | ICD-10-CM | POA: Insufficient documentation

## 2018-10-07 DIAGNOSIS — H18891 Other specified disorders of cornea, right eye: Secondary | ICD-10-CM | POA: Diagnosis not present

## 2018-10-07 DIAGNOSIS — H53141 Visual discomfort, right eye: Secondary | ICD-10-CM | POA: Insufficient documentation

## 2018-10-07 MED ORDER — CIPROFLOXACIN HCL 0.3 % OP OINT: TOPICAL_OINTMENT | OPHTHALMIC | 0 refills | Status: DC

## 2018-10-07 MED ORDER — TETRACAINE HCL 0.5 % OP SOLN
2.0000 [drp] | Freq: Once | OPHTHALMIC | Status: AC
  Administered 2018-10-07: 20:00:00 2 [drp] via OPHTHALMIC
  Filled 2018-10-07: qty 4

## 2018-10-07 MED ORDER — FLUORESCEIN SODIUM 1 MG OP STRP
2.0000 | ORAL_STRIP | Freq: Once | OPHTHALMIC | Status: AC
  Administered 2018-10-07: 2 via OPHTHALMIC
  Filled 2018-10-07: qty 2

## 2018-10-07 NOTE — ED Triage Notes (Signed)
C/O right eye irritation.    Patient states she wears contacts and noticed swelling and drainage to her right eye yesterday. Patient took her contacts out but, put them back in to run errands which made the right eye more irritated.   A/Ox4 Ambulatory in triage.

## 2018-10-07 NOTE — Discharge Instructions (Signed)
You were seen in the ER for right eye irritation.  Eye exam today was overall normal but you had a very small area of stain that showed a small abrasion.  This was treated with antibiotic ointment.  Corneal abrasions typically heal within 48 to 72 hours to you should start to feel better in 2 to 3 days.  You need to be reevaluated by an eye doctor in 2 to 3 days to ensure that it is healing well.  Return to the ER if there is any worsening vision changes, vision loss, pus drainage from the eye, eyelid swelling, facial swelling, fever, severe headache

## 2018-10-07 NOTE — ED Provider Notes (Signed)
[facility]   CSN: [id number] Arrival date & time: 10/07/18  1825    History   Chief Complaint Chief Complaint  Patient presents with  . Eye Pain    HPI Erica Horton is a 23 y.o. female presents to the ER for evaluation of right eye discomfort.  Onset yesterday.  States that she put on her right contact and work for 3 hours.  She removed it and felt like she had to talk a little bit because it was dry.  She had some mild irritation after this but eventually went to bed and was fine.  She woke up this morning and had blurred vision described as "a cloud" over her vision on the right side.  Associated with gritty sensation, watery tearing, photophobia.  Interventions.  No alleviating factors.  States typically she wears glasses but she lost some.  She denies any right-sided headache, fevers, eyelid swelling. No alleviating factors.      HPI  Past Medical History:  Diagnosis Date  . Chlamydia infection affecting pregnancy in third trimester     Patient Active Problem List   Diagnosis Date Noted  . Indication for care or intervention related to labor and delivery 03/03/2016  . NSVD (normal spontaneous vaginal delivery) 03/03/2016    Past Surgical History:  Procedure Laterality Date  . HERNIA REPAIR       OB History    Gravida  1   Para  1   Term  1   Preterm  0   AB  0   Living  1     SAB      TAB      Ectopic      Multiple  0   Live Births  1            Home Medications    Prior to Admission medications   Medication Sig Start Date End Date Taking? Authorizing Provider  cetirizine (ZYRTEC) 5 MG tablet Take 1 tablet (5 mg total) by mouth daily. 10/30/17   Janace ArisBast, Traci A, NP  ciprofloxacin (CILOXAN) 0.3 % ophthalmic ointment Apply 1/2 inch ribbon into the inner bottom eye lid 3 times a day for a total of 7 days 10/07/18   Liberty HandyGibbons, Amond Speranza J, PA-C  ibuprofen (ADVIL,MOTRIN) 600 MG tablet Take 1 tablet (600  mg total) by mouth every 6 (six) hours. 03/05/16   Meisinger, Tawanna Coolerodd, MD  naproxen (NAPROSYN) 500 MG tablet Take 1 tablet (500 mg total) by mouth 2 (two) times daily as needed. 03/13/17   Ward, Chase PicketJaime Pilcher, PA-C  Prenatal Vit-Fe Fumarate-FA (PRENATAL MULTIVITAMIN) TABS tablet Take 1 tablet by mouth daily at 12 noon.    [provider]    Family History History reviewed. No pertinent family history.  Social History Social History   Tobacco Use  . Smoking status: Never Smoker  . Smokeless tobacco: Never Used  Substance Use Topics  . Alcohol use: No  . Drug use: No     Allergies   Coconut oil   Review of Systems Review of Systems  Eyes: Positive for photophobia, pain, discharge, redness and visual disturbance.  All other systems reviewed and are negative.    Physical Exam Updated Vital Signs BP 122/75 (BP Location: Right Arm)   Pulse 73   Temp 98.5 F (36.9 C) (Oral)   Resp 18   LMP 10/04/2018   SpO2 100%   Physical Exam Constitutional:      Appearance: She  is well-developed.  HENT:     Head: Normocephalic.     Nose: Nose normal.  Eyes:     General: Lids are normal.     Comments:  RIGHT EYE: PERRL. +Direct photosensitivity, no consensual photosensitivity. EOMs intact, painless. Upper/lower lids without erythema, edema, tenderness, warmth, palpable mass, lesions.  Conjunctiva and sclera slightly injected with prominent vessels.  No limbic flush.  IOP 19. Fluorescein uptake: none. Visual acuity: per RN (pt did not have her prescription glasses). No cell or flare on slit lamp exam.   LEFT EYE: PERRL.  No direct or consensual photosensitivity. EOMs intact, painless. Upper/lower lids without erythema, edema, tenderness, warmth, palpable mass, lesions.  Conjunctiva and sclera white without prominent vessels.  No limbic flush.  IOP 19. Visual acuity per RN (pt did not have her prescription glasses).  No cell or flare on slit lamp exam.   Neck:     Musculoskeletal:  Normal range of motion.  Cardiovascular:     Rate and Rhythm: Normal rate.  Pulmonary:     Effort: Pulmonary effort is normal. No respiratory distress.  Musculoskeletal: Normal range of motion.  Neurological:     Mental Status: She is alert.  Psychiatric:        Behavior: Behavior normal.      ED Treatments / Results  Labs (all labs ordered are listed, but only abnormal results are displayed) Labs Reviewed - No data to display  EKG None  Radiology No results found.  Procedures Procedures (including critical care time)  Medications Ordered in ED Medications  fluorescein ophthalmic strip 2 strip (2 strips Both Eyes Given 10/07/18 1954)  tetracaine (PONTOCAINE) 0.5 % ophthalmic solution 2 drop (2 drops Both Eyes Given 10/07/18 1954)     Initial Impression / Assessment and Plan / ED Course  I have reviewed the triage vital signs and the nursing notes.  Pertinent labs & imaging results that were available during my care of the patient were reviewed by me and considered in my medical decision making (see chart for details).  ddx includes corneal abrasion vs foreign body vs conjunctivitis.  History/exam not consistent with retinal detachment, globe injury, glaucoma, cellulitis, temporal arteritis.  No fluorescein uptake on exam however pt has classic symptoms of abrasion including grittiness, watery teary, photophobia, h/o recent contact lens use.  IOPs normal. No cell/flare, consensual photosensitivity on exam and iritis unlikely.   Given risk will tx with cipro ointment, protective glasses.  Given ophtho f/u and encouraged f/u in 2-3 days for repeat eye exam. Return precautions given.   Final Clinical Impressions(s) / ED Diagnoses   Final diagnoses:  Corneal irritation of right eye    ED Discharge Orders         Ordered    ciprofloxacin (CILOXAN) 0.3 % ophthalmic ointment  Status:  Discontinued     10/07/18 2113    ciprofloxacin (CILOXAN) 0.3 % ophthalmic ointment      10/07/18 2128           Kinnie Feil, PA-C 10/07/18 2258    Pattricia Boss, MD 10/08/18 [phone]

## 2018-10-07 NOTE — ED Notes (Signed)
Did not have her glasses for visual acuity.
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
## 2019-04-01 ENCOUNTER — Other Ambulatory Visit: Payer: Self-pay

## 2019-04-01 ENCOUNTER — Encounter (HOSPITAL_COMMUNITY): Payer: Self-pay

## 2019-04-01 ENCOUNTER — Emergency Department (HOSPITAL_COMMUNITY)
Admission: EM | Admit: 2019-04-01 | Discharge: 2019-04-01 | Disposition: A | Discharge status: Home / Self Care | Payer: 59 | Attending: Emergency Medicine | Admitting: Emergency Medicine

## 2019-04-01 DIAGNOSIS — O99891 Other specified diseases and conditions complicating pregnancy: Secondary | ICD-10-CM | POA: Insufficient documentation

## 2019-04-01 DIAGNOSIS — Y92410 Unspecified street and highway as the place of occurrence of the external cause: Secondary | ICD-10-CM | POA: Insufficient documentation

## 2019-04-01 DIAGNOSIS — Z79899 Other long term (current) drug therapy: Secondary | ICD-10-CM | POA: Insufficient documentation

## 2019-04-01 DIAGNOSIS — S8012XA Contusion of left lower leg, initial encounter: Secondary | ICD-10-CM | POA: Diagnosis not present

## 2019-04-01 DIAGNOSIS — Z3A00 Weeks of gestation of pregnancy not specified: Secondary | ICD-10-CM | POA: Diagnosis not present

## 2019-04-01 DIAGNOSIS — Y939 Activity, unspecified: Secondary | ICD-10-CM | POA: Insufficient documentation

## 2019-04-01 DIAGNOSIS — Y999 Unspecified external cause status: Secondary | ICD-10-CM | POA: Diagnosis not present

## 2019-04-01 LAB — HCG, QUANTITATIVE, PREGNANCY: hCG, Beta Chain, Quant, S: 74853 m[IU]/mL — ABNORMAL HIGH (ref <5)

## 2019-04-01 MED ORDER — ACETAMINOPHEN 325 MG PO TABS: 325.0000 mg | ORAL_TABLET | Freq: Once | ORAL | Status: DC
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
## 2019-04-01 NOTE — ED Notes (Signed)
FHT 154
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
## 2019-10-01 ENCOUNTER — Inpatient Hospital Stay (HOSPITAL_COMMUNITY)
Admission: AD | Admit: 2019-10-01 | Discharge: 2019-10-03 | DRG: 807 | Disposition: A | Discharge status: Home / Self Care | Payer: 59 | Attending: Family Medicine | Admitting: Family Medicine

## 2019-10-01 ENCOUNTER — Other Ambulatory Visit: Payer: Self-pay

## 2019-10-01 ENCOUNTER — Inpatient Hospital Stay (HOSPITAL_COMMUNITY): Payer: 59 | Admitting: Anesthesiology

## 2019-10-01 ENCOUNTER — Encounter (HOSPITAL_COMMUNITY): Payer: Self-pay | Admitting: Family Medicine

## 2019-10-01 DIAGNOSIS — Z3A39 39 weeks gestation of pregnancy: Secondary | ICD-10-CM

## 2019-10-01 DIAGNOSIS — O26893 Other specified pregnancy related conditions, third trimester: Secondary | ICD-10-CM | POA: Diagnosis present

## 2019-10-01 DIAGNOSIS — O365930 Maternal care for other known or suspected poor fetal growth, third trimester, not applicable or unspecified: Principal | ICD-10-CM | POA: Diagnosis present

## 2019-10-01 LAB — CBC
HCT: 37.8 % (ref 36.0–46.0)
Hemoglobin: 12.5 g/dL (ref 12.0–15.0)
MCH: 29.7 pg (ref 26.0–34.0)
MCHC: 33.1 g/dL (ref 30.0–36.0)
MCV: 89.8 fL (ref 80.0–100.0)
Platelets: 184 10*3/uL (ref 150–400)
RBC: 4.21 MIL/uL (ref 3.87–5.11)
RDW: 13.3 % (ref 11.5–15.5)
WBC: 12.6 10*3/uL — ABNORMAL HIGH (ref 4.0–10.5)
nRBC: 0 % (ref 0.0–0.2)

## 2019-10-01 LAB — TYPE AND SCREEN
ABO/RH(D): O POS
Antibody Screen: NEGATIVE

## 2019-10-01 LAB — POCT FERN TEST: POCT Fern Test: POSITIVE

## 2019-10-01 MED ORDER — ACETAMINOPHEN 325 MG PO TABS: 650.0000 mg | ORAL_TABLET | ORAL | Status: DC | PRN

## 2019-10-01 MED ORDER — PRENATAL MULTIVITAMIN CH
1.0000 | ORAL_TABLET | Freq: Every day | ORAL | Status: DC
  Administered 2019-10-02: 1 via ORAL
  Filled 2019-10-01: qty 1

## 2019-10-01 MED ORDER — ONDANSETRON HCL 4 MG/2ML IJ SOLN: 4.0000 mg | Freq: Four times a day (QID) | INTRAMUSCULAR | Status: DC | PRN

## 2019-10-01 MED ORDER — EPHEDRINE 5 MG/ML INJ
10.0000 mg | INTRAVENOUS | Status: DC | PRN
  Filled 2019-10-01: qty 2

## 2019-10-01 MED ORDER — FENTANYL CITRATE (PF) 100 MCG/2ML IJ SOLN
100.0000 ug | INTRAMUSCULAR | Status: DC | PRN
  Administered 2019-10-01 (×2): 100 ug via INTRAVENOUS
  Filled 2019-10-01 (×2): qty 2

## 2019-10-01 MED ORDER — ONDANSETRON HCL 4 MG PO TABS: 4.0000 mg | ORAL_TABLET | ORAL | Status: DC | PRN

## 2019-10-01 MED ORDER — DIBUCAINE (PERIANAL) 1 % EX OINT: 1.0000 "application " | TOPICAL_OINTMENT | CUTANEOUS | Status: DC | PRN

## 2019-10-01 MED ORDER — SOD CITRATE-CITRIC ACID 500-334 MG/5ML PO SOLN: 30.0000 mL | ORAL | Status: DC | PRN

## 2019-10-01 MED ORDER — PHENYLEPHRINE 40 MCG/ML (10ML) SYRINGE FOR IV PUSH (FOR BLOOD PRESSURE SUPPORT)
80.0000 ug | PREFILLED_SYRINGE | INTRAVENOUS | Status: DC | PRN
  Filled 2019-10-01: qty 10

## 2019-10-01 MED ORDER — SENNOSIDES-DOCUSATE SODIUM 8.6-50 MG PO TABS
2.0000 | ORAL_TABLET | ORAL | Status: DC
  Administered 2019-10-01 – 2019-10-02 (×2): 2 via ORAL
  Filled 2019-10-01 (×2): qty 2

## 2019-10-01 MED ORDER — BENZOCAINE-MENTHOL 20-0.5 % EX AERO
1.0000 "application " | INHALATION_SPRAY | CUTANEOUS | Status: DC | PRN
  Administered 2019-10-01 – 2019-10-02 (×2): 1 via TOPICAL
  Filled 2019-10-01 (×2): qty 56

## 2019-10-01 MED ORDER — LACTATED RINGERS IV SOLN: 500.0000 mL | Freq: Once | INTRAVENOUS | Status: DC

## 2019-10-01 MED ORDER — OXYTOCIN-SODIUM CHLORIDE 30-0.9 UT/500ML-% IV SOLN
2.5000 [IU]/h | INTRAVENOUS | Status: DC
  Administered 2019-10-01: 2.5 [IU]/h via INTRAVENOUS
  Filled 2019-10-01: qty 500

## 2019-10-01 MED ORDER — SIMETHICONE 80 MG PO CHEW: 80.0000 mg | CHEWABLE_TABLET | ORAL | Status: DC | PRN

## 2019-10-01 MED ORDER — LACTATED RINGERS IV SOLN: INTRAVENOUS | Status: DC

## 2019-10-01 MED ORDER — LIDOCAINE HCL (PF) 1 % IJ SOLN: 30.0000 mL | INTRAMUSCULAR | Status: DC | PRN

## 2019-10-01 MED ORDER — DIPHENHYDRAMINE HCL 25 MG PO CAPS: 25.0000 mg | ORAL_CAPSULE | Freq: Four times a day (QID) | ORAL | Status: DC | PRN

## 2019-10-01 MED ORDER — FENTANYL-BUPIVACAINE-NACL 0.5-0.125-0.9 MG/250ML-% EP SOLN
12.0000 mL/h | EPIDURAL | Status: DC | PRN
  Filled 2019-10-01: qty 250

## 2019-10-01 MED ORDER — WITCH HAZEL-GLYCERIN EX PADS: 1.0000 "application " | MEDICATED_PAD | CUTANEOUS | Status: DC | PRN

## 2019-10-01 MED ORDER — DIPHENHYDRAMINE HCL 50 MG/ML IJ SOLN: 12.5000 mg | INTRAMUSCULAR | Status: DC | PRN

## 2019-10-01 MED ORDER — LIDOCAINE HCL (PF) 1 % IJ SOLN
INTRAMUSCULAR | Status: DC | PRN
  Administered 2019-10-01 (×2): 5 mL via EPIDURAL

## 2019-10-01 MED ORDER — ZOLPIDEM TARTRATE 5 MG PO TABS: 5.0000 mg | ORAL_TABLET | Freq: Every evening | ORAL | Status: DC | PRN

## 2019-10-01 MED ORDER — SODIUM CHLORIDE (PF) 0.9 % IJ SOLN
INTRAMUSCULAR | Status: DC | PRN
  Administered 2019-10-01: 13 mL/h via EPIDURAL

## 2019-10-01 MED ORDER — LACTATED RINGERS IV SOLN: 500.0000 mL | INTRAVENOUS | Status: DC | PRN

## 2019-10-01 MED ORDER — PHENYLEPHRINE 40 MCG/ML (10ML) SYRINGE FOR IV PUSH (FOR BLOOD PRESSURE SUPPORT)
80.0000 ug | PREFILLED_SYRINGE | INTRAVENOUS | Status: DC | PRN
  Filled 2019-10-01 (×2): qty 10

## 2019-10-01 MED ORDER — IBUPROFEN 600 MG PO TABS
600.0000 mg | ORAL_TABLET | Freq: Four times a day (QID) | ORAL | Status: DC
  Administered 2019-10-01 – 2019-10-03 (×6): 600 mg via ORAL
  Filled 2019-10-01 (×6): qty 1

## 2019-10-01 MED ORDER — ONDANSETRON HCL 4 MG/2ML IJ SOLN: 4.0000 mg | INTRAMUSCULAR | Status: DC | PRN

## 2019-10-01 MED ORDER — OXYTOCIN BOLUS FROM INFUSION
333.0000 mL | Freq: Once | INTRAVENOUS | Status: AC
  Administered 2019-10-01: 333 mL via INTRAVENOUS

## 2019-10-01 NOTE — Discharge Instructions (Signed)
Postpartum Care After Vaginal Delivery This sheet gives you information about how to care for yourself from the time you deliver your baby to up to 6-12 weeks after delivery (postpartum period). Your health care provider may also give you more specific instructions. If you have problems or questions, contact your health care provider. Follow these instructions at home: Vaginal bleeding  It is normal to have vaginal bleeding (lochia) after delivery. Wear a sanitary pad for vaginal bleeding and discharge. ? During the first week after delivery, the amount and appearance of lochia is often similar to a menstrual period. ? Over the next few weeks, it will gradually decrease to a dry, yellow-brown discharge. ? For most women, lochia stops completely by 4-6 weeks after delivery. Vaginal bleeding can vary from woman to woman.  Change your sanitary pads frequently. Watch for any changes in your flow, such as: ? A sudden increase in volume. ? A change in color. ? Large blood clots.  If you pass a blood clot from your vagina, save it and call your health care provider to discuss. Do not flush blood clots down the toilet before talking with your health care provider.  Do not use tampons or douches until your health care provider says this is safe.  If you are not breastfeeding, your period should return 6-8 weeks after delivery. If you are feeding your child breast milk only (exclusive breastfeeding), your period may not return until you stop breastfeeding. Perineal care  Keep the area between the vagina and the anus (perineum) clean and dry as told by your health care provider. Use medicated pads and pain-relieving sprays and creams as directed.  If you had a cut in the perineum (episiotomy) or a tear in the vagina, check the area for signs of infection until you are healed. Check for: ? More redness, swelling, or pain. ? Fluid or blood coming from the cut or tear. ? Warmth. ? Pus or a bad  smell.  You may be given a squirt bottle to use instead of wiping to clean the perineum area after you go to the bathroom. As you start healing, you may use the squirt bottle before wiping yourself. Make sure to wipe gently.  To relieve pain caused by an episiotomy, a tear in the vagina, or swollen veins in the anus (hemorrhoids), try taking a warm sitz bath 2-3 times a day. A sitz bath is a warm water bath that is taken while you are sitting down. The water should only come up to your hips and should cover your buttocks. Breast care  Within the first few days after delivery, your breasts may feel heavy, full, and uncomfortable (breast engorgement). Milk may also leak from your breasts. Your health care provider can suggest ways to help relieve the discomfort. Breast engorgement should go away within a few days.  If you are breastfeeding: ? Wear a bra that supports your breasts and fits you well. ? Keep your nipples clean and dry. Apply creams and ointments as told by your health care provider. ? You may need to use breast pads to absorb milk that leaks from your breasts. ? You may have uterine contractions every time you breastfeed for up to several weeks after delivery. Uterine contractions help your uterus return to its normal size. ? If you have any problems with breastfeeding, work with your health care provider or lactation consultant.  If you are not breastfeeding: ? Avoid touching your breasts a lot. Doing this can make   your breasts produce more milk. ? Wear a good-fitting bra and use cold packs to help with swelling. ? Do not squeeze out (express) milk. This causes you to make more milk. Intimacy and sexuality  Ask your health care provider when you can engage in sexual activity. This may depend on: ? Your risk of infection. ? How fast you are healing. ? Your comfort and desire to engage in sexual activity.  You are able to get pregnant after delivery, even if you have not had  your period. If desired, talk with your health care provider about methods of birth control (contraception). Medicines  Take over-the-counter and prescription medicines only as told by your health care provider.  If you were prescribed an antibiotic medicine, take it as told by your health care provider. Do not stop taking the antibiotic even if you start to feel better. Activity  Gradually return to your normal activities as told by your health care provider. Ask your health care provider what activities are safe for you.  Rest as much as possible. Try to rest or take a nap while your baby is sleeping. Eating and drinking   Drink enough fluid to keep your urine pale yellow.  Eat high-fiber foods every day. These may help prevent or relieve constipation. High-fiber foods include: ? Whole grain cereals and breads. ? Brown rice. ? Beans. ? Fresh fruits and vegetables.  Do not try to lose weight quickly by cutting back on calories.  Take your prenatal vitamins until your postpartum checkup or until your health care provider tells you it is okay to stop. Lifestyle  Do not use any products that contain nicotine or tobacco, such as cigarettes and e-cigarettes. If you need help quitting, ask your health care provider.  Do not drink alcohol, especially if you are breastfeeding. General instructions  Keep all follow-up visits for you and your baby as told by your health care provider. Most women visit their health care provider for a postpartum checkup within the first 3-6 weeks after delivery. Contact a health care provider if:  You feel unable to cope with the changes that your child brings to your life, and these feelings do not go away.  You feel unusually sad or worried.  Your breasts become red, painful, or hard.  You have a fever.  You have trouble holding urine or keeping urine from leaking.  You have little or no interest in activities you used to enjoy.  You have not  breastfed at all and you have not had a menstrual period for 12 weeks after delivery.  You have stopped breastfeeding and you have not had a menstrual period for 12 weeks after you stopped breastfeeding.  You have questions about caring for yourself or your baby.  You pass a blood clot from your vagina. Get help right away if:  You have chest pain.  You have difficulty breathing.  You have sudden, severe leg pain.  You have severe pain or cramping in your lower abdomen.  You bleed from your vagina so much that you fill more than one sanitary pad in one hour. Bleeding should not be heavier than your heaviest period.  You develop a severe headache.  You faint.  You have blurred vision or spots in your vision.  You have bad-smelling vaginal discharge.  You have thoughts about hurting yourself or your baby. If you ever feel like you may hurt yourself or others, or have thoughts about taking your own life, get help  vagina so much that you fill more than one sanitary pad in one hour. Bleeding should not be heavier than your heaviest period.  · You develop a severe headache.  · You faint.  · You have blurred vision or spots in your vision.  · You have bad-smelling vaginal discharge.  · You have thoughts about hurting yourself or your baby.  If you ever feel like you may hurt yourself or others, or have thoughts about taking your own life, get help right away. You can go to the nearest emergency department or call:  · Your local emergency services (911 in the [place]  · A suicide crisis helpline, such as the National Suicide Prevention Lifeline at 1-800-273-8255. This is open 24 hours a day.  Summary  · The period of time right after you deliver your newborn up to 6-12 weeks after delivery is called the postpartum period.  · Gradually return to your normal activities as told by your health care provider.  · Keep all follow-up visits for you and your baby as told by your health care provider.  This information is not intended to replace advice given to you by your health care provider. Make sure you discuss any questions you have with your health care provider.  Document Revised: 02/17/2017 Document Reviewed: 11/28/2016  Elsevier Patient Education © 2020 [employer]  Breastfeeding    Choosing to breastfeed is one of the best decisions you can make for yourself and your baby. A change in hormones during pregnancy causes your  breasts to make breast milk in your milk-producing glands. Hormones prevent breast milk from being released before your baby is born. They also prompt milk flow after birth. Once breastfeeding has begun, thoughts of your baby, as well as his or her sucking or crying, can stimulate the release of milk from your milk-producing glands.  Benefits of breastfeeding  Research shows that breastfeeding offers many health benefits for infants and mothers. It also offers a cost-free and convenient way to feed your baby.  For your baby  · Your first milk (colostrum) helps your baby's digestive system to function better.  · Special cells in your milk (antibodies) help your baby to fight off infections.  · Breastfed babies are less likely to develop asthma, allergies, obesity, or type 2 diabetes. They are also at lower risk for sudden infant death syndrome (SIDS).  · Nutrients in breast milk are better able to meet your baby’s needs compared to infant formula.  · Breast milk improves your baby's brain development.  For you  · Breastfeeding helps to create a very special bond between you and your baby.  · Breastfeeding is convenient. Breast milk costs nothing and is always available at the correct temperature.  · Breastfeeding helps to burn calories. It helps you to lose the weight that you gained during pregnancy.  · Breastfeeding makes your uterus return faster to its size before pregnancy. It also slows bleeding (lochia) after you give birth.  · Breastfeeding helps to lower your risk of developing type 2 diabetes, osteoporosis, rheumatoid arthritis, cardiovascular disease, and breast, ovarian, uterine, and endometrial cancer later in life.  Breastfeeding basics  Starting breastfeeding  · Find a comfortable place to sit or lie down, with your neck and back well-supported.  · Place a pillow or a rolled-up blanket under your baby to bring him or her to the level of your breast (if you are seated). Nursing pillows are specially  designed to help support your arms   and your baby while you breastfeed.  · Make sure that your baby's tummy (abdomen) is facing your abdomen.  · Gently massage your breast. With your fingertips, massage from the outer edges of your breast inward toward the nipple. This encourages milk flow. If your milk flows slowly, you may need to continue this action during the feeding.  · Support your breast with 4 fingers underneath and your thumb above your nipple (make the letter "C" with your hand). Make sure your fingers are well away from your nipple and your baby’s mouth.  · Stroke your baby's lips gently with your finger or nipple.  · When your baby's mouth is open wide enough, quickly bring your baby to your breast, placing your entire nipple and as much of the areola as possible into your baby's mouth. The areola is the colored area around your nipple.  ? More areola should be visible above your baby's upper lip than below the lower lip.  ? Your baby's lips should be opened and extended outward (flanged) to ensure an adequate, comfortable latch.  ? Your baby's tongue should be between his or her lower gum and your breast.  · Make sure that your baby's mouth is correctly positioned around your nipple (latched). Your baby's lips should create a seal on your breast and be turned out (everted).  · It is common for your baby to suck about 2-3 minutes in order to start the flow of breast milk.  Latching  Teaching your baby how to latch onto your breast properly is very important. An improper latch can cause nipple pain, decreased milk supply, and poor weight gain in your baby. Also, if your baby is not latched onto your nipple properly, he or she may swallow some air during feeding. This can make your baby fussy. Burping your baby when you switch breasts during the feeding can help to get rid of the air. However, teaching your baby to latch on properly is still the best way to prevent fussiness from swallowing air while  breastfeeding.  Signs that your baby has successfully latched onto your nipple  · Silent tugging or silent sucking, without causing you pain. Infant's lips should be extended outward (flanged).  · Swallowing heard between every 3-4 sucks once your milk has started to flow (after your let-down milk reflex occurs).  · Muscle movement above and in front of his or her ears while sucking.  Signs that your baby has not successfully latched onto your nipple  · Sucking sounds or smacking sounds from your baby while breastfeeding.  · Nipple pain.  If you think your baby has not latched on correctly, slip your finger into the corner of your baby’s mouth to break the suction and place it between your baby's gums. Attempt to start breastfeeding again.  Signs of successful breastfeeding  Signs from your baby  · Your baby will gradually decrease the number of sucks or will completely stop sucking.  · Your baby will fall asleep.  · Your baby's body will relax.  · Your baby will retain a small amount of milk in his or her mouth.  · Your baby will let go of your breast by himself or herself.  Signs from you  · Breasts that have increased in firmness, weight, and size 1-3 hours after feeding.  · Breasts that are softer immediately after breastfeeding.  · Increased milk volume, as well as a change in milk consistency and color by the fifth day of breastfeeding.  ·   Nipples that are not sore, cracked, or bleeding.  Signs that your baby is getting enough milk  · Wetting at least 1-2 diapers during the first 24 hours after birth.  · Wetting at least 5-6 diapers every 24 hours for the first week after birth. The urine should be clear or pale yellow by the age of 5 days.  · Wetting 6-8 diapers every 24 hours as your baby continues to grow and develop.  · At least 3 stools in a 24-hour period by the age of 5 days. The stool should be soft and yellow.  · At least 3 stools in a 24-hour period by the age of 7 days. The stool should be seedy and  yellow.  · No loss of weight greater than 10% of birth weight during the first 3 days of life.  · Average weight gain of 4-7 oz (113-198 g) per week after the age of 4 days.  · Consistent daily weight gain by the age of 5 days, without weight loss after the age of 2 weeks.  After a feeding, your baby may spit up a small amount of milk. This is normal.  Breastfeeding frequency and duration  Frequent feeding will help you make more milk and can prevent sore nipples and extremely full breasts (breast engorgement). Breastfeed when you feel the need to reduce the fullness of your breasts or when your baby shows signs of hunger. This is called "breastfeeding on demand." Signs that your baby is hungry include:  · Increased alertness, activity, or restlessness.  · Movement of the head from side to side.  · Opening of the mouth when the corner of the mouth or cheek is stroked (rooting).  · Increased sucking sounds, smacking lips, cooing, sighing, or squeaking.  · Hand-to-mouth movements and sucking on fingers or hands.  · Fussing or crying.  Avoid introducing a pacifier to your baby in the first 4-6 weeks after your baby is born. After this time, you may choose to use a pacifier. Research has shown that pacifier use during the first year of a baby's life decreases the risk of sudden infant death syndrome (SIDS).  Allow your baby to feed on each breast as long as he or she wants. When your baby unlatches or falls asleep while feeding from the first breast, offer the second breast. Because newborns are often sleepy in the first few weeks of life, you may need to awaken your baby to get him or her to feed.  Breastfeeding times will vary from baby to baby. However, the following rules can serve as a guide to help you make sure that your baby is properly fed:  · Newborns (babies 4 weeks of age or younger) may breastfeed every 1-3 hours.  · Newborns should not go without breastfeeding for longer than 3 hours during the day or 5  hours during the night.  · You should breastfeed your baby a minimum of 8 times in a 24-hour period.  Breast milk pumping         Pumping and storing breast milk allows you to make sure that your baby is exclusively fed your breast milk, even at times when you are unable to breastfeed. This is especially important if you go back to work while you are still breastfeeding, or if you are not able to be present during feedings. Your lactation consultant can help you find a method of pumping that works best for you and give you guidelines about how long   after you give birth. The following recommendations can help to ease engorgement:  Completely empty your breasts while breastfeeding or pumping. You may want to start by applying warm, moist heat (in the shower or with warm, water-soaked hand towels) just before feeding or pumping. This increases circulation and helps the  milk flow. If your baby does not completely empty your breasts while breastfeeding, pump any extra milk after he or she is finished.  Apply ice packs to your breasts immediately after breastfeeding or pumping, unless this is too uncomfortable for you. To do this: ? Put ice in a plastic bag. ? Place a towel between your skin and the bag. ? Leave the ice on for 20 minutes, 2-3 times a day.  Make sure that your baby is latched on and positioned properly while breastfeeding. If engorgement persists after 48 hours of following these recommendations, contact your health care provider or a [employer]. Overall health care recommendations while breastfeeding  Eat 3 healthy meals and 3 snacks every day. Well-nourished mothers who are breastfeeding need an additional 450-500 calories a day. You can meet this requirement by increasing the amount of a balanced diet that you eat.  Drink enough water to keep your urine pale yellow or clear.  Rest often, relax, and continue to take your prenatal vitamins to prevent fatigue, stress, and low vitamin and mineral levels in your body (nutrient deficiencies).  Do not use any products that contain nicotine or tobacco, such as cigarettes and e-cigarettes. Your baby may be harmed by chemicals from cigarettes that pass into breast milk and exposure to secondhand smoke. If you need help quitting, ask your health care provider.  Avoid alcohol.  Do not use illegal drugs or marijuana.  Talk with your health care provider before taking any medicines. These include over-the-counter and prescription medicines as well as vitamins and herbal supplements. Some medicines that may be harmful to your baby can pass through breast milk.  It is possible to become pregnant while breastfeeding. If birth control is desired, ask your health care provider about options that will be safe while breastfeeding your baby. Where to find more information: [employer]: www.llli.org Contact a health care provider if:  You feel like you want to stop breastfeeding or have become frustrated with breastfeeding.  Your nipples are cracked or bleeding.  Your breasts are red, tender, or warm.  You have: ? Painful breasts or nipples. ? A swollen area on either breast. ? A fever or chills. ? Nausea or vomiting. ? Drainage other than breast milk from your nipples.  Your breasts do not become full before feedings by the fifth day after you give birth.  You feel sad and depressed.  Your baby is: ? Too sleepy to eat well. ? Having trouble sleeping. ? More than 72 week old and wetting fewer than 6 diapers in a 24-hour period. ? Not gaining weight by 49 days of age.  Your baby has fewer than 3 stools in a 24-hour period.  Your baby's skin or the white parts of his or her eyes become yellow. Get help right away if:  Your baby is overly tired (lethargic) and does not want to wake up and feed.  Your baby develops an unexplained fever. Summary  Breastfeeding offers many health benefits for infant and mothers.  Try to breastfeed your infant when he or she shows early signs of hunger.  Gently tickle or stroke your baby's lips with your finger or nipple  to allow the baby to open his or her mouth. Bring the baby to your breast. Make sure that much of the areola is in your baby's mouth. Offer one side and burp the baby before you offer the other side.  Talk with your health care provider or lactation consultant if you have questions or you face problems as you breastfeed. This information is not intended to replace advice given to you by your health care provider. Make sure you discuss any questions you have with your health care provider. Document Revised: 05/11/2017 Document Reviewed: 03/18/2016 Elsevier Patient Education  2020 [employer].

## 2019-10-01 NOTE — Discharge Summary (Signed)
Postpartum Discharge Summary    Patient Name: Erica Horton DOB: [birth year] MRN: [id number]  Date of admission: 10/01/2019 Delivery date:10/01/2019  Delivering provider: Gaylan Gerold R  Date of discharge: 10/03/2019  Admitting diagnosis: Normal labor [O80, Z37.9] Intrauterine pregnancy: [redacted]w[redacted]d    Secondary diagnosis:  Active Problems:   Normal labor  Additional problems: None    Discharge diagnosis: Term Pregnancy Delivered                                              Post partum procedures:None Augmentation: None Complications: None  Hospital course: Onset of Labor With Vaginal Delivery      24y.o. yo G2P1001 at 349w3das admitted in Active Labor on 10/01/2019. Patient had an uncomplicated labor course as follows:  Membrane Rupture Time/Date: 12:50 PM ,10/01/2019   Delivery Method:Vaginal, Spontaneous  Episiotomy: None  Lacerations:  None  Patient had an uncomplicated postpartum course.  She is ambulating, tolerating a regular diet, passing flatus, and urinating well. Patient is discharged home in stable condition on 10/03/19.  Newborn Data: Birth date:10/01/2019  Birth time:4:55 PM  Gender:Female  Living status:Living  Apgars:9 ,9  Weight:6 lb 13 oz (3.09 kg)   Magnesium Sulfate received: No BMZ received: No Rhophylac:N/A MMR:N/A T-DaP:Given prenatally Flu: N/A Transfusion:No  Physical exam  Vitals:   10/02/19 0818 10/02/19 1431 10/02/19 2215 10/03/19 0532  BP: 98/87 106/61 118/76 (!) 98/49  Pulse: 71 67 68 61  Resp: _0 Temp: 98 F (36.7 C) 98.1 F (36.7 C) 99.1 F (37.3 C) 97.9 F (36.6 C)  TempSrc: Oral Oral Oral Oral  SpO2: 100% 100%    Weight:      Height:       General: alert, cooperative and no distress Lochia: appropriate Uterine Fundus: firm Incision: N/A DVT Evaluation: No evidence of DVT seen on physical exam. Labs: Lab Results  Component Value Date   WBC 14.5 (H) 10/02/2019   HGB 10.2 (L) 10/02/2019   HCT 30.7 (L) 10/02/2019    MCV 90.0 10/02/2019   PLT 153 10/02/2019   No flowsheet data found. Edinburgh Score: Edinburgh Postnatal Depression Scale Screening Tool 10/02/2019  I have been able to laugh and see the funny side of things. 0  I have looked forward with enjoyment to things. 0  I have blamed myself unnecessarily when things went wrong. 0  I have been anxious or worried for no good reason. 1  I have felt scared or panicky for no good reason. 0  Things have been getting on top of me. 0  I have been so unhappy that I have had difficulty sleeping. 0  I have felt sad or miserable. 0  I have been so unhappy that I have been crying. 0  The thought of harming myself has occurred to me. 0  Edinburgh Postnatal Depression Scale Total 1     After visit meds:  Allergies as of 10/03/2019      Reactions   Coconut Oil Rash      Medication List    TAKE these medications   cetirizine 5 MG tablet Commonly known as: ZYRTEC Take 1 tablet (5 mg total) by mouth daily.   ciprofloxacin 0.3 % ophthalmic ointment Commonly known as: CILOXAN Apply 1/2 inch ribbon into the inner bottom eye lid 3 times a day for a total of  7 days   ibuprofen 600 MG tablet Commonly known as: ADVIL Take 1 tablet (600 mg total) by mouth every 6 (six) hours.   naproxen 500 MG tablet Commonly known as: NAPROSYN Take 1 tablet (500 mg total) by mouth 2 (two) times daily as needed.   prenatal multivitamin Tabs tablet Take 1 tablet by mouth daily at 12 noon.        Discharge home in stable condition Infant Feeding: No evidence of DVT seen on physical exam. Negative Homan's sign. No cords or calf tenderness. No significant calf/ankle edema. Infant Disposition:home with mother Discharge instruction: per After Visit Summary and Postpartum booklet. Activity: Advance as tolerated. Pelvic rest for 6 weeks.  Diet: routine diet Future Appointments: Future Appointments  Date Time Provider [place]  11/08/2019  8:55 AM [place],  Rona Ravens, NP [facility]   Follow up Visit:   Please schedule this patient for a Virtual postpartum visit in 4 weeks with the following provider: Any provider. Additional Postpartum F/U:None  Low risk pregnancy complicated by: None Delivery mode:  Vaginal, Spontaneous  Anticipated Birth Control:  Unsure

## 2019-10-01 NOTE — H&P (Signed)
Erica Horton is a 24 y.o. G2P1001 at [redacted]w[redacted]d who presents to MAU with regular, strong contractions. She had PNC at [facility] and had an induction scheduled tomorrow (for declining fetal growth), but came here because her contractions were so strong. SROM in MAU, fern+. OB History    Gravida  2   Para  1   Term  1   Preterm  0   AB  0   Living  1     SAB      TAB      Ectopic      Multiple  0   Live Births  1          Past Medical History:  Diagnosis Date  . Chlamydia infection affecting pregnancy in third trimester    Past Surgical History:  Procedure Laterality Date  . HERNIA REPAIR     Family History: family history is not on file. Social History:  reports that she has never smoked. She has never used smokeless tobacco. She reports that she does not drink alcohol and does not use drugs.     Maternal Diabetes: No Genetic Screening: Declined Maternal Ultrasounds/Referrals: Normal Fetal Ultrasounds or other Referrals:  None Maternal Substance Abuse:  No Significant Maternal Medications:  None Significant Maternal Lab Results:  Group B Strep negative Other Comments:  None  Review of Systems  Gastrointestinal: Nausea: intermittent throughout the pregnancy.  Musculoskeletal: Positive for back pain (lower back from pregnancy, shoulder pain from tension).  Neurological: Positive for headaches (early in pregnancy, none now).  All other systems reviewed and are negative.  Maternal Medical History:  Reason for admission: Rupture of membranes and contractions.  Nausea: intermittent throughout the pregnancy.   Contractions: Onset was 6-12 hours ago.   Frequency: regular.   Perceived severity is moderate.    Fetal activity: Perceived fetal activity is normal.   Last perceived fetal movement was within the past hour.    Prenatal complications: no prenatal complications Prenatal Complications - Diabetes: none.    Dilation: 8 Effacement (%): 100 Station:  0 Exam by:: lee Blood pressure 134/68, pulse 93, temperature 98 F (36.7 C), temperature source Oral, resp. rate 16, height 5\' 11"  (1.803 m), weight 175 lb 9.6 oz (79.7 kg), last menstrual period 01/16/2019, SpO2 100 %, unknown if currently breastfeeding. Maternal Exam:  Uterine Assessment: Contraction strength is moderate.  Contraction frequency is regular.   Abdomen: Patient reports no abdominal tenderness. Fetal presentation: vertex  Introitus: Normal vulva. Normal vagina.  Ferning test: positive.  Amniotic fluid character: clear.  Pelvis: adequate for delivery.   Cervix: Cervix evaluated by digital exam.     Fetal Exam Fetal Monitor Review: Mode: ultrasound.   Variability: moderate (6-25 bpm).   Pattern: accelerations present and no decelerations.    Fetal State Assessment: Category I - tracings are normal.     Physical Exam Vitals and nursing note reviewed. Exam conducted with a chaperone present.  Constitutional:      Appearance: Normal appearance. She is normal weight.  HENT:     Head: Normocephalic.  Eyes:     Pupils: Pupils are equal, round, and reactive to light.  Cardiovascular:     Rate and Rhythm: Normal rate and regular rhythm.     Pulses: Normal pulses.     Heart sounds: Normal heart sounds.  Pulmonary:     Effort: Pulmonary effort is normal.     Breath sounds: Normal breath sounds.  Abdominal:     General: Bowel  sounds are normal.  Genitourinary:    General: Normal vulva.  Musculoskeletal:        General: Normal range of motion.     Cervical back: Normal range of motion.  Skin:    General: Skin is warm and dry.     Capillary Refill: Capillary refill takes less than 2 seconds.  Neurological:     Mental Status: She is alert and oriented to person, place, and time.  Psychiatric:        Mood and Affect: Mood normal.        Behavior: Behavior normal.        Thought Content: Thought content normal.        Judgment: Judgment normal.     Prenatal  labs: ABO, Rh: --/--/O POS (08/03 1236)O+ Antibody: NEG (08/03 1236) Rubella:  immune RPR:   non-reactive HBsAg:   negative HIV:   negative GBS:   negative TDap given prenatally Flu declined Covid negative (pre-admission tested 09/27/19)  Assessment/Plan: - Active labor, contracting consistently - Planning epidural for pain relief - Admit to L&D, anticipate NSVD  Edd Arbour, CNM, MSN, IBCLC

## 2019-10-01 NOTE — Anesthesia Preprocedure Evaluation (Signed)
Anesthesia Evaluation  Patient identified by MRN, date of birth, ID band Patient awake    Reviewed: Allergy & Precautions, H&P , Patient's Chart, lab work & pertinent test results  Airway Mallampati: II  TM Distance: >3 FB Neck ROM: full    Dental no notable dental hx. (+) Teeth Intact   Pulmonary neg pulmonary ROS,    Pulmonary exam normal breath sounds clear to auscultation       Cardiovascular negative cardio ROS Normal cardiovascular exam Rhythm:regular Rate:Normal     Neuro/Psych negative neurological ROS  negative psych ROS   GI/Hepatic negative GI ROS, Neg liver ROS,   Endo/Other  negative endocrine ROS  Renal/GU negative Renal ROS  negative genitourinary   Musculoskeletal   Abdominal   Peds  Hematology negative hematology ROS (+)   Anesthesia Other Findings   Reproductive/Obstetrics (+) Pregnancy                             Anesthesia Physical Anesthesia Plan  ASA: II  Anesthesia Plan: Epidural   Post-op Pain Management:    Induction:   PONV Risk Score and Plan:   Airway Management Planned:   Additional Equipment:   Intra-op Plan:   Post-operative Plan:   Informed Consent: I have reviewed the patients History and Physical, chart, labs and discussed the procedure including the risks, benefits and alternatives for the proposed anesthesia with the patient or authorized representative who has indicated his/her understanding and acceptance.     Plan Discussed with: Anesthesiologist  Anesthesia Plan Comments:         Anesthesia Quick Evaluation  

## 2019-10-01 NOTE — Anesthesia Procedure Notes (Signed)
Epidural Patient location during procedure: OB Start time: 10/01/2019 2:19 PM End time: 10/01/2019 2:28 PM  Staffing Anesthesiologist: Mal Amabile, MD Performed: anesthesiologist   Preanesthetic Checklist Completed: patient identified, IV checked, site marked, risks and benefits discussed, surgical consent, monitors and equipment checked, pre-op evaluation and timeout performed  Epidural Patient position: sitting Prep: DuraPrep and site prepped and draped Patient monitoring: continuous pulse ox and blood pressure Approach: midline Location: L3-L4 Injection technique: LOR air  Needle:  Needle type: Tuohy  Needle gauge: 17 G Needle length: 9 cm and 9 Needle insertion depth: 5 cm cm Catheter type: closed end flexible Catheter size: 19 Gauge Catheter at skin depth: 10 cm Test dose: negative and Other  Assessment Events: blood not aspirated, injection not painful, no injection resistance, no paresthesia and negative IV test  Additional Notes Patient identified. Risks and benefits discussed including failed block, incomplete  Pain control, post dural puncture headache, nerve damage, paralysis, blood pressure Changes, nausea, vomiting, reactions to medications-both toxic and allergic and post Partum back pain. All questions were answered. Patient expressed understanding and wished to proceed. Sterile technique was used throughout procedure. Epidural site was Dressed with sterile barrier dressing. No paresthesias, signs of intravascular injection Or signs of intrathecal spread were encountered.  Patient was more comfortable after the epidural was dosed. Please see RN's note for documentation of vital signs and FHR which are stable. Reason for block:procedure for pain

## 2019-10-01 NOTE — MAU Note (Addendum)
Presents for labor evaluation.  Reports ctxs every 3 minutes.  Denies VB or LOF.  Endorses +FM. Reports scheduled for IOL @ [facility] today for IUGR.

## 2019-10-02 LAB — CBC
HCT: 30.7 % — ABNORMAL LOW (ref 36.0–46.0)
Hemoglobin: 10.2 g/dL — ABNORMAL LOW (ref 12.0–15.0)
MCH: 29.9 pg (ref 26.0–34.0)
MCHC: 33.2 g/dL (ref 30.0–36.0)
MCV: 90 fL (ref 80.0–100.0)
Platelets: 153 10*3/uL (ref 150–400)
RBC: 3.41 MIL/uL — ABNORMAL LOW (ref 3.87–5.11)
RDW: 13.2 % (ref 11.5–15.5)
WBC: 14.5 10*3/uL — ABNORMAL HIGH (ref 4.0–10.5)
nRBC: 0 % (ref 0.0–0.2)

## 2019-10-02 LAB — RPR: RPR Ser Ql: NONREACTIVE

## 2019-10-02 NOTE — Anesthesia Postprocedure Evaluation (Signed)
Anesthesia Post Note  Patient: Erica Horton  Procedure(s) Performed: AN AD HOC LABOR EPIDURAL     Patient location during evaluation: Mother Baby Anesthesia Type: Epidural Level of consciousness: awake and alert and oriented Pain management: satisfactory to patient Vital Signs Assessment: post-procedure vital signs reviewed and stable Respiratory status: respiratory function stable Cardiovascular status: stable Postop Assessment: no headache, no backache, epidural receding, patient able to bend at knees, no signs of nausea or vomiting, adequate PO intake and able to ambulate Anesthetic complications: no   No complications documented.  Last Vitals:  Vitals:   10/02/19 0345 10/02/19 0818  BP: 113/72 98/87  Pulse: 65 71  Resp: 18 16  Temp: 36.6 C 36.7 C  SpO2: 100% 100%    Last Pain:  Vitals:   10/02/19 0818  TempSrc: Oral  PainSc: 2    Pain Goal: Patients Stated Pain Goal: 2 (10/02/19 0818)              Epidural/Spinal Function Cutaneous sensation: Normal sensation (10/02/19 0818), Patient able to flex knees: Yes (10/02/19 0818), Patient able to lift hips off bed: Yes (10/02/19 0818), Back pain beyond tenderness at insertion site: No (10/02/19 0818), Progressively worsening motor and/or sensory loss: No (10/02/19 0818), Bowel and/or bladder incontinence post epidural: No (10/02/19 0818)  Gunnard Dorrance

## 2019-10-02 NOTE — Lactation Note (Signed)
This note was copied from a baby's chart. Lactation Consultation Note  Patient Name: Erica Horton [id number] Date: 10/02/2019 Reason for consult: Initial assessment   Mother is a P2, infant is 80 hours old . Mother reports that she breastfed her first child for 3-4 months.   Mother was given [facility] brochure and basic teaching done.  Mother reports that infant is feeding well.  Reviewed hand expression with mother. Observed large drops of colostrum. Mother was given a harmony hand pump with instructions. Mothers nipples are erect with compressible breast tissue. No observed trama of mothers nipples.    Mother was observed with infant latched on at the left breast. Observed infant suckling with audible swallows. Infant sustained latch for 20 mins.  Discussed treatment and prevention of engorgement.  Mother to continue to cue base feed infant and feed at least 8-12 times or more in 24 hours and advised to allow for cluster feeding infant as needed.    Mother to continue to due STS. Mother is aware of available LC services at [facility], BFSG'S, OP Dept, and phone # for questions or concerns about breastfeeding.  Mother receptive to all teaching and plan of care.     Maternal Data Has patient been taught Hand Expression?: Yes Does the patient have breastfeeding experience prior to this delivery?: Yes  Feeding Feeding Type: Breast Fed  LATCH Score Latch: Grasps breast easily, tongue down, lips flanged, rhythmical sucking.  Audible Swallowing: Spontaneous and intermittent  Type of Nipple: Everted at rest and after stimulation  Comfort (Breast/Nipple): Soft / non-tender  Hold (Positioning): No assistance needed to correctly position infant at breast.  LATCH Score: 10  Interventions Interventions: Skin to skin;Hand express;Breast compression;Support pillows;Position options;Hand pump  Lactation Tools Discussed/Used     Consult Status Consult Status: Follow-up Date:  10/03/19 Follow-up type: In-patient    Stevan Born [facility] 10/02/2019, 1:01 PM

## 2019-10-02 NOTE — Progress Notes (Signed)
Post Partum Day 1 Subjective: no complaints, up ad lib, voiding and tolerating PO  Objective: Blood pressure 113/72, pulse 65, temperature 97.8 F (36.6 C), temperature source Oral, resp. rate 18, height 5\' 11"  (1.803 m), weight 79.7 kg, last menstrual period 01/16/2019, SpO2 100 %, unknown if currently breastfeeding.  Physical Exam:  General: alert, cooperative and no distress Lochia: appropriate Uterine Fundus: firm Incision:  DVT Evaluation: No evidence of DVT seen on physical exam.  Recent Labs    10/01/19 1300  HGB 12.5  HCT 37.8    Assessment/Plan: Plan for discharge tomorrow   LOS: 1 day   12/01/19 10/02/2019, 6:37 AM

## 2019-10-03 NOTE — Lactation Note (Signed)
This note was copied from a baby's chart. Lactation Consultation Note  Patient Name: Erica Horton [id number] Date: 10/03/2019 Reason for consult: Follow-up assessment   Infant 42 hours. Mother breastfeeding infant in laid back position. Mother denies having any difficulties with breastfeeding. Mother has a hand pump from the hospital .  She inquired about getting a hospital grade pump. She is not currently active with WIC. 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 Mother informed of available rental from the [employer] as well as signing up for [facility].   Discussed treatment and prevention of engorgement.   Mother to continue to cue base feed infant and feed at least 8-12 times or more in 24 hours and advised to allow for cluster feeding infant as needed.  Mother to continue to due STS. Mother is aware of available LC services at [facility], BFSG'S, OP Dept, and phone # for questions or concerns about breastfeeding.  Mother receptive to all teaching and plan of care.     Maternal Data    Feeding Feeding Type: Breast Fed  LATCH Score                   Interventions Interventions: Hand pump  Lactation Tools Discussed/Used     Consult Status Consult Status: Complete    Michel Bickers 10/03/2019, 11:50 AM

## 2019-11-08 ENCOUNTER — Telehealth (INDEPENDENT_AMBULATORY_CARE_PROVIDER_SITE_OTHER): Payer: 59 | Admitting: Nurse Practitioner

## 2019-11-08 ENCOUNTER — Other Ambulatory Visit: Payer: Self-pay

## 2019-11-08 ENCOUNTER — Encounter: Payer: Self-pay | Admitting: Nurse Practitioner

## 2019-11-08 ENCOUNTER — Encounter: Payer: Self-pay | Admitting: Family Medicine

## 2019-11-08 DIAGNOSIS — Z30011 Encounter for initial prescription of contraceptive pills: Secondary | ICD-10-CM

## 2019-11-08 MED ORDER — NORETHINDRONE 0.35 MG PO TABS
1.0000 | ORAL_TABLET | Freq: Every day | ORAL | 11 refills | Status: DC
Start: 2019-11-08 — End: 2020-11-29

## 2019-11-08 NOTE — Progress Notes (Signed)
I connected with@ on 11/08/19 at  8:55 AM EDT by: D[employer]im[employer] video and verified that I am speaking with the correct person using two identifiers.  Patient is located at home and provider is located at [employer] for [employer] at [employer] [facility] .     The purpose of this virtual visit is to provide medical care while limiting exposure to the novel coronavirus. I discussed the limitations, risks, security and privacy concerns of performing an evaluation and management service by D[employer] and the availability of in person appointments. I also discussed with the patient that there may be a patient responsible charge related to this service. By engaging in this virtual visit, you consent to the provision of healthcare.  Additionally, you authorize for your insurance to be billed for the services provided during this visit.  The patient expressed understanding and agreed to proceed.  The following staff members participated in the virtual visit:  Ralene Bathe, RN and Nolene Bernheim, NP  Post Partum Visit Note Subjective:   Erica Horton is a 24 y.o. [phone] female being evaluated for postpartum followup.  She is 4 weeks postpartum following a normal spontaneous vaginal delivery at  [redacted]w[redacted]d gestational weeks.  I have fully reviewed the prenatal and intrapartum course; pregnancy complicated by prenatal care at [facility] but delivery was at [facility].  Wants future care to be at [facility]  Postpartum course has been good. Baby is doing well. Baby is feeding by breast. Bleeding no bleeding. Bowel function is normal. Bladder function is normal. Patient is not sexually active. Contraception method is none. Postpartum depression screening: negative.   The pregnancy intention screening data noted above was reviewed. Potential methods of contraception were discussed. The patient elected to proceed with Oral Contraceptive - progestin only pill.   The following portions of the patient's history were  reviewed and updated as appropriate: allergies, current medications, past family history, past medical history, past social history, past surgical history and problem list.  Review of Systems Pertinent items noted in HPI and remainder of comprehensive ROS otherwise negative.   Objective:  There were no vitals filed for this visit. Self-Obtained       Assessment:    Normal virtual postpartum exam.  Plan:  Essential components of care per ACOG recommendations:  1.  Mood and well being: Patient with negative depression screening today. Reviewed local resources for support.  - Patient does not use tobacco. - hx of drug use? No  *  2. Infant care and feeding:  -Patient currently breastmilk feeding? Yes  -Social determinants of health (SDOH) reviewed in EPIC. No concerns  3. Sexuality, contraception and birth spacing - Patient does not want a pregnancy in the next year. - Reviewed forms of contraception in tiered fashion. Patient desired oral progesterone-only contraceptive today.   - Discussed birth spacing of 18 months  4. Sleep and fatigue -Encouraged family/partner/community support of 4 hrs of uninterrupted sleep to help with mood and fatigue  5. Physical Recovery  - Discussed patients delivery and complications - Patient had no laceration, perineal healing reviewed. Patient expressed understanding - Patient has urinary incontinence? No- Patient is safe to resume physical and sexual activity  6.  Health Maintenance - Last pap smear done unknown time and unable to confirm pap smear date - client reports she has had pap.  12 minutes of non-face-to-face time spent with the patient    Ralene Bathe, [employer] for [employer], [employer]  [facility]  Nolene Bernheim, RN, MSN, NP-BC Nurse Practitioner, [facility] for [employer], [facility] 11/08/2019 10:44 PM

## 2020-11-29 ENCOUNTER — Other Ambulatory Visit: Payer: Self-pay

## 2020-11-29 ENCOUNTER — Inpatient Hospital Stay (HOSPITAL_COMMUNITY)
Admission: AD | Admit: 2020-11-29 | Discharge: 2020-11-29 | Disposition: A | Discharge status: Home / Self Care | Payer: 59 | Attending: Obstetrics and Gynecology | Admitting: Obstetrics and Gynecology

## 2020-11-29 ENCOUNTER — Ambulatory Visit (HOSPITAL_COMMUNITY)
Admission: EM | Admit: 2020-11-29 | Discharge: 2020-11-29 | Payer: 59 | Attending: Emergency Medicine | Admitting: Emergency Medicine

## 2020-11-29 ENCOUNTER — Inpatient Hospital Stay (HOSPITAL_COMMUNITY): Payer: 59

## 2020-11-29 ENCOUNTER — Encounter (HOSPITAL_COMMUNITY): Payer: Self-pay | Admitting: Obstetrics and Gynecology

## 2020-11-29 DIAGNOSIS — O209 Hemorrhage in early pregnancy, unspecified: Secondary | ICD-10-CM | POA: Diagnosis not present

## 2020-11-29 DIAGNOSIS — O4692 Antepartum hemorrhage, unspecified, second trimester: Secondary | ICD-10-CM

## 2020-11-29 DIAGNOSIS — Z3201 Encounter for pregnancy test, result positive: Secondary | ICD-10-CM | POA: Diagnosis not present

## 2020-11-29 DIAGNOSIS — Z202 Contact with and (suspected) exposure to infections with a predominantly sexual mode of transmission: Secondary | ICD-10-CM | POA: Insufficient documentation

## 2020-11-29 DIAGNOSIS — Z3A20 20 weeks gestation of pregnancy: Secondary | ICD-10-CM | POA: Diagnosis not present

## 2020-11-29 HISTORY — DX: Gastritis, unspecified, without bleeding: K29.70

## 2020-11-29 LAB — POC URINE PREG, ED: Preg Test, Ur: POSITIVE — AB

## 2020-11-29 LAB — URINALYSIS, ROUTINE W REFLEX MICROSCOPIC
Bacteria, UA: NONE SEEN
Bilirubin Urine: NEGATIVE
Glucose, UA: NEGATIVE mg/dL
Ketones, ur: NEGATIVE mg/dL
Nitrite: NEGATIVE
Protein, ur: NEGATIVE mg/dL
Specific Gravity, Urine: 1.023 (ref 1.005–1.030)
pH: 7 (ref 5.0–8.0)

## 2020-11-29 LAB — WET PREP, GENITAL
Clue Cells Wet Prep HPF POC: NONE SEEN
Sperm: NONE SEEN
Trich, Wet Prep: NONE SEEN
Yeast Wet Prep HPF POC: NONE SEEN

## 2020-11-29 MED ORDER — PREPLUS 27-1 MG PO TABS: 1.0000 | ORAL_TABLET | Freq: Every day | ORAL | 13 refills | Status: DC

## 2020-11-29 NOTE — MAU Note (Signed)
Pt reports to mau with c/o vag spotting that started this morning.  Denies abd pain.  Endorses concern for STD.

## 2020-11-29 NOTE — ED Notes (Signed)
Patient is being discharged from the Urgent Care and sent to the Emergency Department via personal vehicle . Per Provider Chales Salmon, patient is in need of higher level of care due to positive early pregnancy with bleeding. Patient is aware and verbalizes understanding of plan of care. There were no vitals filed for this visit.

## 2020-11-29 NOTE — MAU Provider Note (Signed)
Chief Complaint:  Vaginal Bleeding and Exposure to STD    HPI: Erica Horton is a 25 y.o. [id number] at Unknown who presents to maternity admissions reporting vaginal spotting. Patient reports dark red/brown blood when wiping this morning. She is not currently having any bleeding or wearing a pad. She is concerned about possible STD. Having intermittent pelvic pain x1 week, but thinks related to sleeping on air mattress. No pain currently. She also reports last intercourse was 2-3 days prior.  Pregnancy Course:   Past Medical History:  Diagnosis Date   Chlamydia infection affecting pregnancy in third trimester    Gastritis    OB History  Gravida Para Term Preterm AB Living  4 2 2  0 1 2  SAB IAB Ectopic Multiple Live Births  1     0 2    # Outcome Date GA Lbr Len/2nd Weight Sex Delivery Anes PTL Lv  4 Current           3 SAB 06/02/20 [redacted]w[redacted]d         2 Term 10/01/19 [redacted]w[redacted]d 10:48 / 00:07 3090 g F Vag-Spont EPI  LIV  1 Term 03/03/16 [redacted]w[redacted]d 14:02 / 00:32 3405 g M Vag-Spont EPI  LIV   Past Surgical History:  Procedure Laterality Date   HERNIA REPAIR     History reviewed. No pertinent family history. Social History   Tobacco Use   Smoking status: Never   Smokeless tobacco: Never  Vaping Use   Vaping Use: Never used  Substance Use Topics   Alcohol use: No   Drug use: No   Allergies  Allergen Reactions   Coconut Oil Rash   No medications prior to admission.    I have reviewed patient's Past Medical Hx, Surgical Hx, Family Hx, Social Hx, medications and allergies.   ROS:  Review of Systems  Constitutional: Negative.   Respiratory: Negative.    Cardiovascular: Negative.   Gastrointestinal: Negative.   Genitourinary:  Positive for pelvic pain and vaginal bleeding. Negative for dysuria and frequency.  Musculoskeletal: Negative.   Neurological: Negative.    Physical Exam  Patient Vitals for the past 24 hrs:  BP Temp Temp src Pulse Resp SpO2  11/29/20 1522 (!) 111/58 -- -- 70  16 100 %  11/29/20 1521 -- 98 F (36.7 C) Oral -- -- --  11/29/20 1242 (!) 113/54 -- -- 71 16 100 %  11/29/20 1205 121/65 98.4 F (36.9 C) Oral 70 15 100 %   Constitutional: well-developed, well-nourished female in no acute distress.  Cardiovascular: normal rate Respiratory: normal effort GI: abd soft, non-tender, fundus at umbilicus MS: extremities nontender, no edema, normal ROM Neurologic: alert and oriented x 4.  GU: neg CVAT. Pelvic: deferred, blind swabs obtained; no blood noted on vaginal swabs   FHT:  147 bpm via doppler   Labs: Results for orders placed or performed during the hospital encounter of 11/29/20 (from the past 24 hour(s))  Urinalysis, Routine w reflex microscopic Urine, Clean Catch     Status: Abnormal   Collection Time: 11/29/20 12:39 PM  Result Value Ref Range   Color, Urine YELLOW YELLOW   APPearance CLOUDY (A) CLEAR   Specific Gravity, Urine 1.023 1.005 - 1.030   pH 7.0 5.0 - 8.0   Glucose, UA NEGATIVE NEGATIVE mg/dL   Hgb urine dipstick MODERATE (A) NEGATIVE   Bilirubin Urine NEGATIVE NEGATIVE   Ketones, ur NEGATIVE NEGATIVE mg/dL   Protein, ur NEGATIVE NEGATIVE mg/dL   Nitrite NEGATIVE NEGATIVE  Leukocytes,Ua TRACE (A) NEGATIVE   RBC / HPF 21-50 0 - 5 RBC/hpf   WBC, UA 6-10 0 - 5 WBC/hpf   Bacteria, UA NONE SEEN NONE SEEN   Squamous Epithelial / LPF 21-50 0 - 5   Mucus PRESENT   Wet prep, genital     Status: Abnormal   Collection Time: 11/29/20  1:06 PM   Specimen: Vaginal  Result Value Ref Range   Yeast Wet Prep HPF POC NONE SEEN NONE SEEN   Trich, Wet Prep NONE SEEN NONE SEEN   Clue Cells Wet Prep HPF POC NONE SEEN NONE SEEN   WBC, Wet Prep HPF POC FEW (A) NONE SEEN   Sperm NONE SEEN     Imaging:  No results found.  MAU Course: Orders Placed This Encounter  Procedures   Wet prep, genital   OB Urine Culture   [place] MFM OB COMP + 14 WK   Urinalysis, Routine w reflex microscopic Urine, Clean Catch   Discharge patient   Meds  ordered this encounter  Medications   Prenatal Vit-Fe Fumarate-FA (PREPLUS) 27-1 MG TABS    Sig: Take 1 tablet by mouth daily.    Dispense:  30 tablet    Refill:  13    Order Specific Question:   Supervising Provider    Answer:   Samara Snide    MDM: Wet prep, GC/CT collected UA with culture pending Preliminary [place] report reviewed with IUP at [redacted]w[redacted]d. No placental abnormalities. Cervical length wnl at 3.3cm.    Assessment: 1. [redacted] weeks gestation of pregnancy   2. Vaginal bleeding affecting early pregnancy     Plan: Discharge home in stable condition  Bleeding precautions reviewed List of OBGYN's provided. Patient instructed to contact any office asap to start prenatal care Labor precautions and fetal kick counts reviewed    Allergies as of 11/29/2020       Reactions   Coconut Oil Rash        Medication List     STOP taking these medications    ciprofloxacin 0.3 % ophthalmic ointment Commonly known as: CILOXAN   ibuprofen 600 MG tablet Commonly known as: ADVIL   naproxen 500 MG tablet Commonly known as: NAPROSYN   norethindrone 0.35 MG tablet Commonly known as: MICRONOR       TAKE these medications    PrePLUS 27-1 MG Tabs Take 1 tablet by mouth daily. What changed:  medication strength when to take this         Camelia Eng, MSN, CNM 11/29/2020 5:33 PM

## 2020-11-29 NOTE — Discharge Instructions (Signed)
Safe Medications in Pregnancy    Acne: Benzoyl Peroxide Salicylic Acid  Backache/Headache: Tylenol: 2 regular strength every 4 hours OR              2 Extra strength every 6 hours  Colds/Coughs/Allergies: Benadryl (alcohol free) 25 mg every 6 hours as needed Breath right strips Claritin Cepacol throat lozenges Chloraseptic throat spray Cold-Eeze- up to three times per day Cough drops, alcohol free Flonase (by prescription only) Guaifenesin Mucinex Robitussin DM (plain only, alcohol free) Saline nasal spray/drops Sudafed (pseudoephedrine) & Actifed ** use only after [redacted] weeks gestation and if you do not have high blood pressure Tylenol Vicks Vaporub Zinc lozenges Zyrtec   Constipation: Colace Ducolax suppositories Fleet enema Glycerin suppositories Metamucil Milk of magnesia Miralax Senokot Smooth move tea  Diarrhea: Kaopectate Imodium A-D  *NO pepto Bismol  Hemorrhoids: Anusol Anusol HC Preparation H Tucks  Indigestion: Tums Maalox Mylanta Zantac  Pepcid  Insomnia: Benadryl (alcohol free) 25mg every 6 hours as needed Tylenol PM Unisom, no Gelcaps  Leg Cramps: Tums MagGel  Nausea/Vomiting:  Bonine Dramamine Emetrol Ginger extract Sea bands Meclizine  Nausea medication to take during pregnancy:  Unisom (doxylamine succinate 25 mg tablets) Take one tablet daily at bedtime. If symptoms are not adequately controlled, the dose can be increased to a maximum recommended dose of two tablets daily (1/2 tablet in the morning, 1/2 tablet mid-afternoon and one at bedtime). Vitamin B6 100mg tablets. Take one tablet twice a day (up to 200 mg per day).  Skin Rashes: Aveeno products Benadryl cream or 25mg every 6 hours as needed Calamine Lotion 1% cortisone cream  Yeast infection: Gyne-lotrimin 7 Monistat 7   **If taking multiple medications, please check labels to avoid duplicating the same active ingredients **take  medication as directed on the label ** Do not exceed 4000 mg of tylenol in 24 hours **Do not take medications that contain aspirin or ibuprofen   [facility] Providers   [facility]             [address], [place], [place] [address] [phone]  [facility]                                                             [address], [address], [place], [place], [address] [phone]  [facility]                                    [address], [address], , [place], [address] [phone]  [facility] [place] [address], [address], [place], [place], [address] [phone]  [facility]                                 [address], [place], [place], [address] [phone]  [facility]                                      [address], [place], [place], [address] [phone]  [facility], [address], [place], [place], [address]                              [facility] [address], [place], [place], [address] [phone]  [facility]         Phone: [phone]  [facility]      Phone: [phone]   [facility]      Phone: [phone]  [facility]         Phone: [phone]   [facility]    Phone: [phone]  [facility]      Phone: [phone]  [facility]     Phone: [phone]  [facility]        Phone: [phone]  [facility]      Phone: [phone]  

## 2020-11-30 LAB — CULTURE, OB URINE

## 2020-11-30 LAB — GC/CHLAMYDIA PROBE AMP (~~LOC~~) NOT AT ARMC
Chlamydia: NEGATIVE
Comment: NEGATIVE
Comment: NORMAL
Neisseria Gonorrhea: NEGATIVE

## 2020-12-30 ENCOUNTER — Other Ambulatory Visit: Payer: Self-pay

## 2020-12-30 ENCOUNTER — Ambulatory Visit (INDEPENDENT_AMBULATORY_CARE_PROVIDER_SITE_OTHER): Payer: 59 | Admitting: Obstetrics and Gynecology

## 2020-12-30 ENCOUNTER — Encounter: Payer: Self-pay | Admitting: Obstetrics and Gynecology

## 2020-12-30 ENCOUNTER — Other Ambulatory Visit (HOSPITAL_COMMUNITY)
Admission: RE | Admit: 2020-12-30 | Discharge: 2020-12-30 | Disposition: A | Discharge status: Home / Self Care | Payer: 59 | Source: Ambulatory Visit | Attending: Obstetrics and Gynecology | Admitting: Obstetrics and Gynecology

## 2020-12-30 DIAGNOSIS — Z3492 Encounter for supervision of normal pregnancy, unspecified, second trimester: Secondary | ICD-10-CM | POA: Insufficient documentation

## 2020-12-30 LAB — POCT URINALYSIS DIP (DEVICE)
Bilirubin Urine: NEGATIVE
Glucose, UA: NEGATIVE mg/dL
Ketones, ur: NEGATIVE mg/dL
Leukocytes,Ua: NEGATIVE
Nitrite: NEGATIVE
Protein, ur: NEGATIVE mg/dL
Specific Gravity, Urine: 1.02 (ref 1.005–1.030)
Urobilinogen, UA: 0.2 mg/dL (ref 0.0–1.0)
pH: 7.5 (ref 5.0–8.0)

## 2020-12-30 NOTE — Patient Instructions (Addendum)
For waterbirth class - go to [email or web address] and register

## 2020-12-30 NOTE — Progress Notes (Signed)
Patient ultrasound scheduled for 01/11/21 @ 12:30pm. Patient notified.  Alesia Richards, RN  12/30/20

## 2020-12-30 NOTE — Progress Notes (Signed)
History:   Erica Horton is a 25 y.o. H1T0569 at [redacted]w[redacted]d by LMP being seen today for her first obstetrical visit.  Her obstetrical history is significant for  none . Patient does intend to breast feed. Pregnancy history fully reviewed.  Patient reports no complaints.     HISTORY: OB History  Gravida Para Term Preterm AB Living  4 2 2  0 1 2  SAB IAB Ectopic Multiple Live Births  1 0 0 0 2    # Outcome Date GA Lbr Len/2nd Weight Sex Delivery Anes PTL Lv  4 Current           3 SAB 06/02/20 [redacted]w[redacted]d         2 Term 10/01/19 [redacted]w[redacted]d 10:48 / 00:07 6 lb 13 oz (3.09 kg) F Vag-Spont EPI  LIV     Name: Erica, Horton     Apgar1: 9  Apgar5: 9  1 Term 03/03/16 [redacted]w[redacted]d 14:02 / 00:32 7 lb 8.1 oz (3.405 kg) M Vag-Spont EPI  LIV     Name: Erica, Horton     Apgar1: 9  Apgar5: 9    Last pap smear was done 06/2019 and was normal  Past Medical History:  Diagnosis Date   Chlamydia infection affecting pregnancy in third trimester    Gastritis    Past Surgical History:  Procedure Laterality Date   HERNIA REPAIR     Family History  Problem Relation Age of Onset   Hypertension Father    Social History   Tobacco Use   Smoking status: Never   Smokeless tobacco: Never  Vaping Use   Vaping Use: Never used  Substance Use Topics   Alcohol use: No   Drug use: No   Allergies  Allergen Reactions   Coconut Oil Rash   Current Outpatient Medications on File Prior to Visit  Medication Sig Dispense Refill   Prenatal Vit-Fe Fumarate-FA (PREPLUS) 27-1 MG TABS Take 1 tablet by mouth daily. 30 tablet 13   No current facility-administered medications on file prior to visit.    Review of Systems Pertinent items noted in HPI and remainder of comprehensive ROS otherwise negative.  Physical Exam:   Vitals:   12/30/20 1546  BP: 105/62  Pulse: 76  Weight: 161 lb 8 oz (73.3 kg)   Fetal Heart Rate (bpm): 149  General: well-developed, well-nourished female in no acute distress     Skin:  normal coloration and turgor, no rashes  Neurologic: oriented, normal, negative, normal mood  Extremities: normal strength, tone, and muscle mass, ROM of all joints is normal  HEENT PERRLA, extraocular movement intact and sclera clear, anicteric  Neck supple and no masses  Cardiovascular: regular rate and rhythm  Respiratory:  no respiratory distress, normal breath sounds  Abdomen: soft, non-tender; bowel sounds normal; no masses,  no organomegaly       Assessment:    Pregnancy: 13/02/22 Patient Active Problem List   Diagnosis Date Noted   Supervision of low-risk pregnancy, second trimester 12/30/2020     Plan:    1. Supervision of low-risk pregnancy, second trimester - Late PNC - Historic blood type is O+ - Will do 2 hr next time - Declines flu shot - Tdap next visit - She is interested in waterbirth. We discussed requirements for this. She will proceed. She is interested in [place].  - [facility] AMB BABYSCRIPTS SCHEDULE OPTIMIZATION - [place] MFM OB COMP + 14 WK; Future   Initial labs drawn. Continue prenatal vitamins. Problem list reviewed  and updated. Genetic Screening discussed, NIPS: ordered. Ultrasound discussed; fetal anatomic survey: ordered. Anticipatory guidance about prenatal visits given including labs, ultrasounds, and testing. Discussed usage of Babyscripts and virtual visits as additional source of managing and completing prenatal visits in midst of coronavirus and pandemic.   Encouraged to complete MyChart Registration for her ability to review results, send requests, and have questions addressed.  The nature of [facility] with multiple MDs and Advanced Practice Providers was explained to patient; also emphasized that residents, students are part of our team. Routine obstetric precautions reviewed. Encouraged to seek out care at office or emergency room [facility] MAU preferred) for urgent and/or emergent concerns. Return in  about 1 month (around 01/29/2021) for OB VISIT, MD or APP, 2 hr GTT.    Milas Hock, MD, FACOG Obstetrician & Gynecologist, [facility], [facility]

## 2020-12-31 LAB — CBC/D/PLT+RPR+RH+ABO+RUBIGG...
Antibody Screen: NEGATIVE
Basophils Absolute: 0 10*3/uL (ref 0.0–0.2)
Basos: 0 %
EOS (ABSOLUTE): 0.1 10*3/uL (ref 0.0–0.4)
Eos: 1 %
HCV Ab: <0.1 s/co ratio (ref 0.0–0.9)
HIV Screen 4th Generation wRfx: NONREACTIVE
Hematocrit: 37.5 % (ref 34.0–46.6)
Hemoglobin: 12.4 g/dL (ref 11.1–15.9)
Hepatitis B Surface Ag: NEGATIVE
Immature Grans (Abs): 0 10*3/uL (ref 0.0–0.1)
Immature Granulocytes: 0 %
Lymphocytes Absolute: 1.6 10*3/uL (ref 0.7–3.1)
Lymphs: 17 %
MCH: 29.7 pg (ref 26.6–33.0)
MCHC: 33.1 g/dL (ref 31.5–35.7)
MCV: 90 fL (ref 79–97)
Monocytes Absolute: 0.4 10*3/uL (ref 0.1–0.9)
Monocytes: 4 %
Neutrophils Absolute: 7.2 10*3/uL — ABNORMAL HIGH (ref 1.4–7.0)
Neutrophils: 78 %
Platelets: 206 10*3/uL (ref 150–450)
RBC: 4.17 x10E6/uL (ref 3.77–5.28)
RDW: 12.8 % (ref 11.7–15.4)
RPR Ser Ql: NONREACTIVE
Rh Factor: POSITIVE
Rubella Antibodies, IGG: 3.2 index (ref >0.99)
WBC: 9.4 10*3/uL (ref 3.4–10.8)

## 2020-12-31 LAB — GC/CHLAMYDIA PROBE AMP (~~LOC~~) NOT AT ARMC
Chlamydia: NEGATIVE
Comment: NEGATIVE
Comment: NORMAL
Neisseria Gonorrhea: NEGATIVE

## 2020-12-31 LAB — HEMOGLOBIN A1C
Est. average glucose Bld gHb Est-mCnc: 100 mg/dL
Hgb A1c MFr Bld: 5.1 % (ref 4.8–5.6)

## 2020-12-31 LAB — HCV INTERPRETATION

## 2021-01-01 LAB — URINE CULTURE, OB REFLEX

## 2021-01-01 LAB — CULTURE, OB URINE

## 2021-01-11 ENCOUNTER — Ambulatory Visit: Payer: 59 | Attending: Obstetrics and Gynecology

## 2021-01-11 ENCOUNTER — Other Ambulatory Visit: Payer: Self-pay | Admitting: Obstetrics and Gynecology

## 2021-01-11 ENCOUNTER — Other Ambulatory Visit: Payer: Self-pay

## 2021-01-11 DIAGNOSIS — Z362 Encounter for other antenatal screening follow-up: Secondary | ICD-10-CM | POA: Insufficient documentation

## 2021-01-11 DIAGNOSIS — Z3A26 26 weeks gestation of pregnancy: Secondary | ICD-10-CM | POA: Diagnosis not present

## 2021-01-11 DIAGNOSIS — Z3492 Encounter for supervision of normal pregnancy, unspecified, second trimester: Secondary | ICD-10-CM

## 2021-01-11 DIAGNOSIS — O0932 Supervision of pregnancy with insufficient antenatal care, second trimester: Secondary | ICD-10-CM | POA: Insufficient documentation

## 2021-01-29 ENCOUNTER — Other Ambulatory Visit: Payer: Self-pay | Admitting: *Deleted

## 2021-01-29 DIAGNOSIS — Z3492 Encounter for supervision of normal pregnancy, unspecified, second trimester: Secondary | ICD-10-CM

## 2021-02-01 ENCOUNTER — Other Ambulatory Visit: Payer: Self-pay

## 2021-02-01 ENCOUNTER — Ambulatory Visit (INDEPENDENT_AMBULATORY_CARE_PROVIDER_SITE_OTHER): Payer: 59

## 2021-02-01 ENCOUNTER — Other Ambulatory Visit: Payer: 59

## 2021-02-01 VITALS — BP 117/80 | HR 83 | Wt 166.7 lb

## 2021-02-01 DIAGNOSIS — Z23 Encounter for immunization: Secondary | ICD-10-CM

## 2021-02-01 DIAGNOSIS — Z3A29 29 weeks gestation of pregnancy: Secondary | ICD-10-CM

## 2021-02-01 DIAGNOSIS — O219 Vomiting of pregnancy, unspecified: Secondary | ICD-10-CM

## 2021-02-01 DIAGNOSIS — Z3403 Encounter for supervision of normal first pregnancy, third trimester: Secondary | ICD-10-CM

## 2021-02-01 DIAGNOSIS — Z5941 Food insecurity: Secondary | ICD-10-CM

## 2021-02-01 MED ORDER — ONDANSETRON 4 MG PO TBDP: 4.0000 mg | ORAL_TABLET | Freq: Three times a day (TID) | ORAL | 0 refills | Status: DC | PRN

## 2021-02-01 NOTE — Patient Instructions (Signed)

## 2021-02-01 NOTE — Progress Notes (Signed)
Patient reports pain/pressure on lower abdomen along with daily nausea.  Tdap was given into left deltoid without any complications. Patient had no questions or concerns.  Dawayne Patricia, CMA   02/01/21

## 2021-02-01 NOTE — Progress Notes (Signed)
   PRENATAL VISIT NOTE  Subjective:  Erica Horton is a 25 y.o. [id number] at [redacted]w[redacted]d being seen today for ongoing prenatal care.  She is currently monitored for the following issues for this low-risk pregnancy and has Supervision of low-risk pregnancy, second trimester on their problem list.  Patient reports nausea and vomiting.  Contractions: Not present. Vag. Bleeding: None.  Movement: Present. Denies leaking of fluid.   The following portions of the patient's history were reviewed and updated as appropriate: allergies, current medications, past family history, past medical history, past social history, past surgical history and problem list.   Objective:   Vitals:   02/01/21 0833  BP: 117/80  Pulse: 83  Weight: 166 lb 11.2 oz (75.6 kg)    Fetal Status: Fetal Heart Rate (bpm): 132 Fundal Height: 28 cm Movement: Present     General:  Alert, oriented and cooperative. Patient is in no acute distress.  Skin: Skin is warm and dry. No rash noted.   Cardiovascular: Normal heart rate noted  Respiratory: Normal respiratory effort, no problems with respiration noted  Abdomen: Soft, gravid, appropriate for gestational age.  Pain/Pressure: Present     Pelvic: Cervical exam deferred        Extremities: Normal range of motion.  Edema: None  Mental Status: Normal mood and affect. Normal behavior. Normal judgment and thought content.   Assessment and Plan:  Pregnancy: [id number] at [redacted]w[redacted]d 1. Supervision of low-risk first pregnancy, third trimester - Patient vomited after drinking glucola. Patient informed that she would have to reschedule glucola which she is okay with doing - Zofran rx sent to patient's pharmacy  - Anticipatory guidance for upcoming appointments provided  2. [redacted] weeks gestation of pregnancy  - Tdap vaccine greater than or equal to 7yo IM  3. Nausea and vomiting during pregnancy  - ondansetron (ZOFRAN-ODT) 4 MG disintegrating tablet; Take 1 tablet (4 mg total) by mouth every 8  (eight) hours as needed for nausea or vomiting.  Dispense: 15 tablet; Refill: 0  4. Food insecurity  - AMBULATORY REFERRAL TO [facility]   Preterm labor symptoms and general obstetric precautions including but not limited to vaginal bleeding, contractions, leaking of fluid and fetal movement were reviewed in detail with the patient. Please refer to After Visit Summary for other counseling recommendations.   Return in about 2 weeks (around 02/15/2021).  Future Appointments  Date Time Provider Department Center  02/04/2021  8:20 AM [facility]  02/15/2021  3:55 PM Allayne Stack, DO [facility]    Brand Males, CNM 02/01/21 1:41 PM

## 2021-02-04 ENCOUNTER — Other Ambulatory Visit: Payer: Self-pay

## 2021-02-04 ENCOUNTER — Other Ambulatory Visit: Payer: 59

## 2021-02-04 DIAGNOSIS — Z3403 Encounter for supervision of normal first pregnancy, third trimester: Secondary | ICD-10-CM

## 2021-02-05 LAB — GLUCOSE TOLERANCE, 2 HOURS W/ 1HR
Glucose, 1 hour: 120 mg/dL (ref 70–179)
Glucose, 2 hour: 91 mg/dL (ref 70–152)
Glucose, Fasting: 78 mg/dL (ref 70–91)

## 2021-02-15 ENCOUNTER — Encounter: Payer: 59 | Admitting: Family Medicine

## 2021-02-28 NOTE — L&D Delivery Note (Addendum)
OB/GYN Faculty Practice Delivery Note Erica Horton is a 26 y.o. H4L9379 s/p SVD at [redacted]w[redacted]d. She was admitted for spontaneous labor.   ROM: 04/15/2021 for 0h 68m, clear fluid  GBS Status: Negative Maximum Maternal Temperature: 97.8  Labor Progress: Pt presented in spontaneous labor progressed to completed without augmentation.  Delivery Date/Time: 04/15/2021 @ 0813 Delivery: Called to room and patient was complete and spontaneously pushing with great effort. After a 2 minute 2nd stage, a viable NBF was delivered over intact perineum via LOA. No nuchal cord present. Shoulder and body delivered in usual fashion in tub. Infant with spontaneous cry, placed on mother's abdomen, dried and stimulated. Cord clamped x 2 after 1-3 minute delay, and cut by FOB.  Cord blood not drawn. Mother ambulated to bed. The placenta separated spontaneously and delivered via CCT and maternal pushing effort.  It was inspected and appears to be intact with a 3 VC. Fundus firm with massage and Pitocin. Labia, perineum, vagina, and cervix inspected inspected with bilateral labia and 1st degree perineal, no repair needed.  EBL: 325 cc Analgesia: none Infant: 3280g   APGARs 9   9  Postpartum Planning SVD: Mom to postpartum.  Baby to Couplet care / Skin to Skin. Routine orders.  The delivery of the shoulders was briefly assisted (w/downward traction on the head) by myself, and the rest of the above was performed under my direct supervision and guidance.

## 2021-03-05 ENCOUNTER — Other Ambulatory Visit: Payer: Self-pay

## 2021-03-05 ENCOUNTER — Ambulatory Visit (INDEPENDENT_AMBULATORY_CARE_PROVIDER_SITE_OTHER): Payer: 59 | Admitting: Obstetrics and Gynecology

## 2021-03-05 VITALS — BP 129/81 | HR 104 | Wt 165.3 lb

## 2021-03-05 DIAGNOSIS — Z3A34 34 weeks gestation of pregnancy: Secondary | ICD-10-CM

## 2021-03-05 DIAGNOSIS — Z3483 Encounter for supervision of other normal pregnancy, third trimester: Secondary | ICD-10-CM

## 2021-03-05 NOTE — Progress Notes (Signed)
° °  PRENATAL VISIT NOTE  Subjective:  Erica Horton is a 26 y.o. [id number] at [redacted]w[redacted]d being seen today for ongoing prenatal care.  She is currently monitored for the following issues for this low-risk pregnancy and has Supervision of low-risk pregnancy, second trimester on their problem list.  Patient reports no complaints.  Contractions: Not present. Vag. Bleeding: None.  Movement: Present. Denies leaking of fluid.   The following portions of the patient's history were reviewed and updated as appropriate: allergies, current medications, past family history, past medical history, past social history, past surgical history and problem list.   Objective:   Vitals:   03/05/21 0948  BP: 129/81  Pulse: (!) 104  Weight: 165 lb 4.8 oz (75 kg)    Fetal Status: Fetal Heart Rate (bpm): 127 Fundal Height: 34 cm Movement: Present  Presentation: Vertex  General:  Alert, oriented and cooperative. Patient is in no acute distress.  Skin: Skin is warm and dry. No rash noted.   Cardiovascular: Normal heart rate noted  Respiratory: Normal respiratory effort, no problems with respiration noted  Abdomen: Soft, gravid, appropriate for gestational age.  Pain/Pressure: Present     Pelvic: Cervical exam deferred        Extremities: Normal range of motion.  Edema: Trace  Mental Status: Normal mood and affect. Normal behavior. Normal judgment and thought content.   Assessment and Plan:  Pregnancy: [id number] at [redacted]w[redacted]d 1. [redacted] weeks gestation of pregnancy Wants to do waterbirth; pt missed cnm visit last time. Pt signing up for WB class and request to see cnm  GBS nv Birth control options d/w her.   Preterm labor symptoms and general obstetric precautions including but not limited to vaginal bleeding, contractions, leaking of fluid and fetal movement were reviewed in detail with the patient. Please refer to After Visit Summary for other counseling recommendations.   Return in about 2 weeks (around 03/19/2021) for  low risk ob, in person, with cnm.  No future appointments.  Nipinnawasee Bing, MD

## 2021-03-18 ENCOUNTER — Other Ambulatory Visit: Payer: Self-pay

## 2021-03-18 ENCOUNTER — Ambulatory Visit (INDEPENDENT_AMBULATORY_CARE_PROVIDER_SITE_OTHER): Payer: 59

## 2021-03-18 ENCOUNTER — Other Ambulatory Visit (HOSPITAL_COMMUNITY)
Admission: RE | Admit: 2021-03-18 | Discharge: 2021-03-18 | Disposition: A | Discharge status: Home / Self Care | Payer: 59 | Source: Ambulatory Visit

## 2021-03-18 VITALS — BP 119/70 | HR 89 | Wt 172.0 lb

## 2021-03-18 DIAGNOSIS — Z3A36 36 weeks gestation of pregnancy: Secondary | ICD-10-CM

## 2021-03-18 DIAGNOSIS — Z3492 Encounter for supervision of normal pregnancy, unspecified, second trimester: Secondary | ICD-10-CM

## 2021-03-18 NOTE — Progress Notes (Signed)
° °  PRENATAL VISIT NOTE  Subjective:  Erica Horton is a 26 y.o. I3B0488 at [redacted]w[redacted]d being seen today for ongoing prenatal care.  She is currently monitored for the following issues for this low-risk pregnancy and has Supervision of low-risk pregnancy, second trimester on their problem list.  Patient reports no complaints.  Contractions: Not present. Vag. Bleeding: None.  Movement: Present. Denies leaking of fluid.   The following portions of the patient's history were reviewed and updated as appropriate: allergies, current medications, past family history, past medical history, past social history, past surgical history and problem list.   Objective:   Vitals:   03/18/21 1033  BP: 119/70  Pulse: 89  Weight: 172 lb (78 kg)    Fetal Status: Fetal Heart Rate (bpm): 134 Fundal Height: 35 cm Movement: Present     General:  Alert, oriented and cooperative. Patient is in no acute distress.  Skin: Skin is warm and dry. No rash noted.   Cardiovascular: Normal heart rate noted  Respiratory: Normal respiratory effort, no problems with respiration noted  Abdomen: Soft, gravid, appropriate for gestational age.  Pain/Pressure: Present     Pelvic: Cervical exam deferred        Extremities: Normal range of motion.  Edema: None  Mental Status: Normal mood and affect. Normal behavior. Normal judgment and thought content.   Assessment and Plan:  Pregnancy: G4P2012 at [redacted]w[redacted]d 1. [redacted] weeks gestation of pregnancy  - Culture, beta strep (group b only) - Cervicovaginal ancillary only( [place])  2. Supervision of low-risk pregnancy, second trimester - Routine OB. Doing well, no concerns - Cultures today - Patient attending waterbirth class tonight. Patient to bring certificate next week. Will sign waterbirth consent at next appointment. - Anticipatory guidance for upcoming appointments provided  Preterm labor symptoms and general obstetric precautions including but not limited to vaginal bleeding,  contractions, leaking of fluid and fetal movement were reviewed in detail with the patient. Please refer to After Visit Summary for other counseling recommendations.   Return in about 1 week (around 03/25/2021).  Future Appointments  Date Time Provider Department Center  03/24/2021  2:15 PM Bernerd Limbo, CNM [facility]    Brand Males, CNM 03/18/21 1:22 PM

## 2021-03-18 NOTE — Progress Notes (Signed)
Patient reports occasional lower abdomen pain, otherwise is feeling fine.   Declines cervix check

## 2021-03-19 LAB — CERVICOVAGINAL ANCILLARY ONLY
Chlamydia: NEGATIVE
Comment: NEGATIVE
Comment: NORMAL
Neisseria Gonorrhea: NEGATIVE

## 2021-03-22 LAB — CULTURE, BETA STREP (GROUP B ONLY): Strep Gp B Culture: NEGATIVE

## 2021-03-24 ENCOUNTER — Other Ambulatory Visit: Payer: Self-pay

## 2021-03-24 ENCOUNTER — Ambulatory Visit (INDEPENDENT_AMBULATORY_CARE_PROVIDER_SITE_OTHER): Payer: 59 | Admitting: Certified Nurse Midwife

## 2021-03-24 VITALS — BP 114/87 | HR 85 | Wt 170.0 lb

## 2021-03-24 DIAGNOSIS — Z3493 Encounter for supervision of normal pregnancy, unspecified, third trimester: Secondary | ICD-10-CM

## 2021-03-24 DIAGNOSIS — Z3A36 36 weeks gestation of pregnancy: Secondary | ICD-10-CM

## 2021-03-25 NOTE — Progress Notes (Signed)
° °  PRENATAL VISIT NOTE  Subjective:  Erica Horton is a 26 y.o. [id number] at [redacted]w[redacted]d being seen today for ongoing prenatal care.  She is currently monitored for the following issues for this low-risk pregnancy and has Supervision of low-risk pregnancy, second trimester on their problem list.  Patient reports no complaints.  Contractions: Not present.  .  Movement: Present. Denies leaking of fluid. Attended waterbirth class and has her certificate via email.  The following portions of the patient's history were reviewed and updated as appropriate: allergies, current medications, past family history, past medical history, past social history, past surgical history and problem list.   Objective:   Vitals:   03/24/21 1423  BP: 114/87  Pulse: 85  Weight: 170 lb (77.1 kg)    Fetal Status: Fetal Heart Rate (bpm): 138 Fundal Height: 37 cm Movement: Present  Presentation: Vertex  General:  Alert, oriented and cooperative. Patient is in no acute distress.  Skin: Skin is warm and dry. No rash noted.   Cardiovascular: Normal heart rate noted  Respiratory: Normal respiratory effort, no problems with respiration noted  Abdomen: Soft, gravid, appropriate for gestational age.  Pain/Pressure: Present     Pelvic: Cervical exam deferred        Extremities: Normal range of motion.  Edema: None  Mental Status: Normal mood and affect. Normal behavior. Normal judgment and thought content.   Assessment and Plan:  Pregnancy: G5O0370 at [redacted]w[redacted]d 1. Supervision of low-risk pregnancy, third trimester - Doing well, feeling regular and vigorous fetal movement   2. [redacted] weeks gestation of pregnancy - Routine OB care  - Waterbirth discussion and consent completed.  Preterm labor symptoms and general obstetric precautions including but not limited to vaginal bleeding, contractions, leaking of fluid and fetal movement were reviewed in detail with the patient. Please refer to After Visit Summary for other counseling  recommendations.   Return for IN-PERSON, LOB.  Future Appointments  Date Time Provider Department Center  03/31/2021  8:35 AM [place], Brand Males, NP [facility]    Bernerd Limbo, CNM

## 2021-03-29 ENCOUNTER — Encounter: Payer: Self-pay | Admitting: *Deleted

## 2021-03-31 ENCOUNTER — Other Ambulatory Visit: Payer: Self-pay

## 2021-03-31 ENCOUNTER — Ambulatory Visit (INDEPENDENT_AMBULATORY_CARE_PROVIDER_SITE_OTHER): Payer: 59 | Admitting: Nurse Practitioner

## 2021-03-31 ENCOUNTER — Ambulatory Visit: Payer: 59 | Attending: Nurse Practitioner

## 2021-03-31 VITALS — BP 113/72 | HR 79 | Wt 171.9 lb

## 2021-03-31 DIAGNOSIS — O26843 Uterine size-date discrepancy, third trimester: Secondary | ICD-10-CM | POA: Diagnosis present

## 2021-03-31 DIAGNOSIS — Z3A37 37 weeks gestation of pregnancy: Secondary | ICD-10-CM

## 2021-03-31 DIAGNOSIS — Z3493 Encounter for supervision of normal pregnancy, unspecified, third trimester: Secondary | ICD-10-CM

## 2021-03-31 NOTE — Progress Notes (Signed)
° ° °  Subjective:  Erica Horton is a 26 y.o. G3O7564 at [redacted]w[redacted]d being seen today for ongoing prenatal care.  She is currently monitored for the following issues for this low-risk pregnancy and has Supervision of low-risk pregnancy, second trimester on their problem list.  Patient reports  lots of pelvic pressure .  Contractions: Not present. Vag. Bleeding: None.  Movement: Present. Denies leaking of fluid.   The following portions of the patient's history were reviewed and updated as appropriate: allergies, current medications, past family history, past medical history, past social history, past surgical history and problem list. Problem list updated.  Objective:   Vitals:   03/31/21 0843  BP: 113/72  Pulse: 79  Weight: 171 lb 14.4 oz (78 kg)    Fetal Status: Fetal Heart Rate (bpm): 131 Fundal Height: 34 cm Movement: Present     General:  Alert, oriented and cooperative. Patient is in no acute distress.  Skin: Skin is warm and dry. No rash noted.   Cardiovascular: Normal heart rate noted  Respiratory: Normal respiratory effort, no problems with respiration noted  Abdomen: Soft, gravid, appropriate for gestational age. Pain/Pressure: Present     Pelvic:  Cervical exam deferred        Extremities: Normal range of motion.  Edema: None  Mental Status: Normal mood and affect. Normal behavior. Normal judgment and thought content.   Urinalysis:      Assessment and Plan:  Pregnancy: [id number] at [redacted]w[redacted]d  1. Supervision of low-risk pregnancy, third trimester Wants waterbirth and wants to wait until 41 weeks if induction is needed Discussed possible IUD for contraception - wants more children but last baby is 15 months now.  Advised more time to space out pregnancies - has not used IUD before but has used Nexplanon and did not tolerate it well.  2. Uterine size-date discrepancy in third trimester Will get [place] today Could be that baby is in the pelvis well and causing the pelvic pressure she  has been having however, fundal measurements are lower today than last week - will get [place] to confirm AFI and interval growth. Scheduled later today.  - [place] MFM OB FOLLOW UP; Future  3. [redacted] weeks gestation of pregnancy   Term labor symptoms and general obstetric precautions including but not limited to vaginal bleeding, contractions, leaking of fluid and fetal movement were reviewed in detail with the patient. Please refer to After Visit Summary for other counseling recommendations.  Return in about 1 week (around 04/07/2021) for in person ROB.  Nolene Bernheim, RN, MSN, NP-BC Nurse Practitioner, [facility] for [employer], [facility] 03/31/2021 9:11 AM

## 2021-04-07 ENCOUNTER — Other Ambulatory Visit: Payer: Self-pay

## 2021-04-07 ENCOUNTER — Ambulatory Visit (INDEPENDENT_AMBULATORY_CARE_PROVIDER_SITE_OTHER): Payer: 59 | Admitting: Certified Nurse Midwife

## 2021-04-07 VITALS — BP 104/60 | HR 77 | Wt 174.0 lb

## 2021-04-07 DIAGNOSIS — Z3A38 38 weeks gestation of pregnancy: Secondary | ICD-10-CM

## 2021-04-07 DIAGNOSIS — Z3493 Encounter for supervision of normal pregnancy, unspecified, third trimester: Secondary | ICD-10-CM

## 2021-04-10 NOTE — Progress Notes (Signed)
° °  PRENATAL VISIT NOTE  Subjective:  Erica Horton is a 26 y.o. S2G3151 at [redacted]w[redacted]d being seen today for ongoing prenatal care.  She is currently monitored for the following issues for this low-risk pregnancy and has Supervision of low-risk pregnancy, second trimester on their problem list.  Patient reports no complaints.  Contractions: Not present. Vag. Bleeding: None.  Movement: Present. Denies leaking of fluid.   The following portions of the patient's history were reviewed and updated as appropriate: allergies, current medications, past family history, past medical history, past social history, past surgical history and problem list.   Objective:   Vitals:   04/07/21 1112  BP: 104/60  Pulse: 77  Weight: 174 lb (78.9 kg)    Fetal Status: Fetal Heart Rate (bpm): 125 Fundal Height: 36 cm Movement: Present  Presentation: Vertex  General:  Alert, oriented and cooperative. Patient is in no acute distress.  Skin: Skin is warm and dry. No rash noted.   Cardiovascular: Normal heart rate noted  Respiratory: Normal respiratory effort, no problems with respiration noted  Abdomen: Soft, gravid, appropriate for gestational age.  Pain/Pressure: Present     Pelvic: Cervical exam deferred        Extremities: Normal range of motion.  Edema: None  Mental Status: Normal mood and affect. Normal behavior. Normal judgment and thought content.   Assessment and Plan:  Pregnancy: [id number] at [redacted]w[redacted]d 1. Supervision of low-risk pregnancy, third trimester - Doing well, feeling regular and vigorous fetal movement   2. [redacted] weeks gestation of pregnancy - Routine OB care   Term labor symptoms and general obstetric precautions including but not limited to vaginal bleeding, contractions, leaking of fluid and fetal movement were reviewed in detail with the patient. Please refer to After Visit Summary for other counseling recommendations.   Return in about 1 week (around 04/14/2021) for IN-PERSON, LOB.  Future  Appointments  Date Time Provider Department Center  04/14/2021  3:35 PM Marylene Land, CNM [facility]    Bernerd Limbo, CNM

## 2021-04-14 ENCOUNTER — Other Ambulatory Visit: Payer: Self-pay | Admitting: Advanced Practice Midwife

## 2021-04-14 ENCOUNTER — Other Ambulatory Visit: Payer: Self-pay

## 2021-04-14 ENCOUNTER — Ambulatory Visit (INDEPENDENT_AMBULATORY_CARE_PROVIDER_SITE_OTHER): Payer: 59 | Admitting: Student

## 2021-04-14 VITALS — BP 115/73 | HR 93 | Wt 175.0 lb

## 2021-04-14 DIAGNOSIS — Z3A39 39 weeks gestation of pregnancy: Secondary | ICD-10-CM

## 2021-04-14 DIAGNOSIS — Z3492 Encounter for supervision of normal pregnancy, unspecified, second trimester: Secondary | ICD-10-CM

## 2021-04-14 NOTE — Progress Notes (Signed)
Patient ID: Erica Horton, female   DOB: 28-Mar-1995, 26 y.o.   MRN: [id number]   PRENATAL VISIT NOTE  Subjective:  Erica Horton is a 26 y.o. [id number] at [redacted]w[redacted]d being seen today for ongoing prenatal care.  She is currently monitored for the following issues for this low-risk pregnancy and has Supervision of low-risk pregnancy, second trimester on their problem list.  Patient reports no complaints.  Contractions: Irritability. Vag. Bleeding: None.  Movement: Present. Denies leaking of fluid.   The following portions of the patient's history were reviewed and updated as appropriate: allergies, current medications, past family history, past medical history, past social history, past surgical history and problem list.   Objective:   Vitals:   04/14/21 1541  BP: 115/73  Pulse: 93  Weight: 175 lb (79.4 kg)    Fetal Status: Fetal Heart Rate (bpm): 129 Fundal Height: 41 cm Movement: Present     General:  Alert, oriented and cooperative. Patient is in no acute distress.  Skin: Skin is warm and dry. No rash noted.   Cardiovascular: Normal heart rate noted  Respiratory: Normal respiratory effort, no problems with respiration noted  Abdomen: Soft, gravid, appropriate for gestational age.  Pain/Pressure: Present     Pelvic: Cervical exam deferred        Extremities: Normal range of motion.     Mental Status: Normal mood and affect. Normal behavior. Normal judgment and thought content.   Assessment and Plan:  Pregnancy: [id number] at [redacted]w[redacted]d 1. Supervision of low-risk pregnancy, second trimester -cervix is 2-3 cm; discussed that we will schedule IOL and that she can start with pitocin at the beginning and then transition to tub after labor has started. At this point she is not an outpatient FB candidate due to her cervical dilation.  -not sure about [facility]; she is considering IUD vs. nothing  Preterm labor symptoms and general obstetric precautions including but not limited to vaginal bleeding,  contractions, leaking of fluid and fetal movement were reviewed in detail with the patient. Please refer to After Visit Summary for other counseling recommendations.   Return 17, 20 or 21 for NST/AFI.  Future Appointments  Date Time Provider [place]  04/19/2021  2:15 PM [facility] [place] [facility]  04/22/2021  2:15 PM [place] [facility] [place]    Mervyn Skeeters [place], [place]

## 2021-04-14 NOTE — Progress Notes (Signed)
Patient reports contractions that would last "5 minutes" and then go away

## 2021-04-15 ENCOUNTER — Inpatient Hospital Stay (HOSPITAL_COMMUNITY)
Admission: AD | Admit: 2021-04-15 | Discharge: 2021-04-17 | DRG: 807 | Disposition: A | Discharge status: Home / Self Care | Payer: 59 | Attending: Obstetrics and Gynecology | Admitting: Obstetrics and Gynecology

## 2021-04-15 ENCOUNTER — Encounter (HOSPITAL_COMMUNITY): Payer: Self-pay | Admitting: Obstetrics & Gynecology

## 2021-04-15 DIAGNOSIS — Z3A40 40 weeks gestation of pregnancy: Secondary | ICD-10-CM | POA: Diagnosis not present

## 2021-04-15 DIAGNOSIS — O26893 Other specified pregnancy related conditions, third trimester: Secondary | ICD-10-CM | POA: Diagnosis present

## 2021-04-15 DIAGNOSIS — Z20822 Contact with and (suspected) exposure to covid-19: Secondary | ICD-10-CM | POA: Diagnosis present

## 2021-04-15 LAB — RESP PANEL BY RT-PCR (FLU A&B, COVID) ARPGX2
Influenza A by PCR: NEGATIVE
Influenza B by PCR: NEGATIVE
SARS Coronavirus 2 by RT PCR: NEGATIVE

## 2021-04-15 LAB — CBC
HCT: 37.2 % (ref 36.0–46.0)
Hemoglobin: 12.7 g/dL (ref 12.0–15.0)
MCH: 30.3 pg (ref 26.0–34.0)
MCHC: 34.1 g/dL (ref 30.0–36.0)
MCV: 88.8 fL (ref 80.0–100.0)
Platelets: 202 10*3/uL (ref 150–400)
RBC: 4.19 MIL/uL (ref 3.87–5.11)
RDW: 13 % (ref 11.5–15.5)
WBC: 8.2 10*3/uL (ref 4.0–10.5)
nRBC: 0 % (ref 0.0–0.2)

## 2021-04-15 LAB — TYPE AND SCREEN
ABO/RH(D): O POS
Antibody Screen: NEGATIVE

## 2021-04-15 MED ORDER — IBUPROFEN 600 MG PO TABS
600.0000 mg | ORAL_TABLET | Freq: Four times a day (QID) | ORAL | Status: DC
  Administered 2021-04-15 – 2021-04-17 (×8): 600 mg via ORAL
  Filled 2021-04-15 (×8): qty 1

## 2021-04-15 MED ORDER — ONDANSETRON HCL 4 MG PO TABS: 4.0000 mg | ORAL_TABLET | ORAL | Status: DC | PRN

## 2021-04-15 MED ORDER — OXYTOCIN BOLUS FROM INFUSION
333.0000 mL | Freq: Once | INTRAVENOUS | Status: DC
  Administered 2021-04-15: 333 mL via INTRAVENOUS

## 2021-04-15 MED ORDER — SOD CITRATE-CITRIC ACID 500-334 MG/5ML PO SOLN: 30.0000 mL | ORAL | Status: DC | PRN

## 2021-04-15 MED ORDER — ONDANSETRON HCL 4 MG/2ML IJ SOLN: 4.0000 mg | Freq: Four times a day (QID) | INTRAMUSCULAR | Status: DC | PRN

## 2021-04-15 MED ORDER — METHYLERGONOVINE MALEATE 0.2 MG PO TABS: 0.2000 mg | ORAL_TABLET | ORAL | Status: DC | PRN

## 2021-04-15 MED ORDER — DOCUSATE SODIUM 100 MG PO CAPS
100.0000 mg | ORAL_CAPSULE | Freq: Two times a day (BID) | ORAL | Status: DC
  Administered 2021-04-15 – 2021-04-17 (×4): 100 mg via ORAL
  Filled 2021-04-15 (×4): qty 1

## 2021-04-15 MED ORDER — OXYCODONE-ACETAMINOPHEN 5-325 MG PO TABS: 1.0000 | ORAL_TABLET | ORAL | Status: DC | PRN

## 2021-04-15 MED ORDER — OXYTOCIN-SODIUM CHLORIDE 30-0.9 UT/500ML-% IV SOLN
2.5000 [IU]/h | INTRAVENOUS | Status: DC
  Filled 2021-04-15: qty 500

## 2021-04-15 MED ORDER — OXYCODONE-ACETAMINOPHEN 5-325 MG PO TABS: 2.0000 | ORAL_TABLET | ORAL | Status: DC | PRN

## 2021-04-15 MED ORDER — ONDANSETRON HCL 4 MG/2ML IJ SOLN: 4.0000 mg | INTRAMUSCULAR | Status: DC | PRN

## 2021-04-15 MED ORDER — FERROUS SULFATE 325 (65 FE) MG PO TABS
325.0000 mg | ORAL_TABLET | ORAL | Status: DC
  Administered 2021-04-15 – 2021-04-17 (×2): 325 mg via ORAL
  Filled 2021-04-15 (×2): qty 1

## 2021-04-15 MED ORDER — SIMETHICONE 80 MG PO CHEW: 80.0000 mg | CHEWABLE_TABLET | ORAL | Status: DC | PRN

## 2021-04-15 MED ORDER — LACTATED RINGERS IV SOLN: INTRAVENOUS | Status: DC

## 2021-04-15 MED ORDER — LACTATED RINGERS IV SOLN: 500.0000 mL | INTRAVENOUS | Status: DC | PRN

## 2021-04-15 MED ORDER — METHYLERGONOVINE MALEATE 0.2 MG/ML IJ SOLN: 0.2000 mg | INTRAMUSCULAR | Status: DC | PRN

## 2021-04-15 MED ORDER — DIBUCAINE (PERIANAL) 1 % EX OINT: 1.0000 "application " | TOPICAL_OINTMENT | CUTANEOUS | Status: DC | PRN

## 2021-04-15 MED ORDER — MEDROXYPROGESTERONE ACETATE 150 MG/ML IM SUSP: 150.0000 mg | INTRAMUSCULAR | Status: DC | PRN

## 2021-04-15 MED ORDER — FLEET ENEMA 7-19 GM/118ML RE ENEM: 1.0000 | ENEMA | Freq: Every day | RECTAL | Status: DC | PRN

## 2021-04-15 MED ORDER — DIPHENHYDRAMINE HCL 25 MG PO CAPS: 25.0000 mg | ORAL_CAPSULE | Freq: Four times a day (QID) | ORAL | Status: DC | PRN

## 2021-04-15 MED ORDER — ACETAMINOPHEN 325 MG PO TABS: 650.0000 mg | ORAL_TABLET | ORAL | Status: DC | PRN

## 2021-04-15 MED ORDER — BENZOCAINE-MENTHOL 20-0.5 % EX AERO
1.0000 | INHALATION_SPRAY | CUTANEOUS | Status: DC | PRN
Start: 2021-04-15 — End: 2021-04-17
  Administered 2021-04-15 – 2021-04-16 (×2): 1 via TOPICAL
  Filled 2021-04-15 (×2): qty 56

## 2021-04-15 MED ORDER — IBUPROFEN 600 MG PO TABS
600.0000 mg | ORAL_TABLET | ORAL | Status: AC
  Administered 2021-04-15: 600 mg via ORAL
  Filled 2021-04-15: qty 1

## 2021-04-15 MED ORDER — BISACODYL 10 MG RE SUPP: 10.0000 mg | Freq: Every day | RECTAL | Status: DC | PRN

## 2021-04-15 MED ORDER — MEASLES, MUMPS & RUBELLA VAC IJ SOLR: 0.5000 mL | Freq: Once | INTRAMUSCULAR | Status: DC

## 2021-04-15 MED ORDER — PRENATAL MULTIVITAMIN CH
1.0000 | ORAL_TABLET | Freq: Every day | ORAL | Status: DC
  Administered 2021-04-15 – 2021-04-17 (×3): 1 via ORAL
  Filled 2021-04-15 (×3): qty 1

## 2021-04-15 MED ORDER — LIDOCAINE HCL (PF) 1 % IJ SOLN: 30.0000 mL | INTRAMUSCULAR | Status: DC | PRN

## 2021-04-15 MED ORDER — TETANUS-DIPHTH-ACELL PERTUSSIS 5-2.5-18.5 LF-MCG/0.5 IM SUSY: 0.5000 mL | PREFILLED_SYRINGE | Freq: Once | INTRAMUSCULAR | Status: DC

## 2021-04-15 MED ORDER — WITCH HAZEL-GLYCERIN EX PADS
1.0000 | MEDICATED_PAD | CUTANEOUS | Status: DC | PRN
Start: 2021-04-15 — End: 2021-04-17

## 2021-04-15 NOTE — H&P (Addendum)
OB ADMISSION/ HISTORY & PHYSICAL:  Admission Date: 04/15/2021  5:59 AM  Admit Diagnosis: CTX 5 mins    Erica Horton is a 26 y.o. female at [redacted]w[redacted]d presenting for spontaneous labor. Patient reports positive fetal movement, denies vaginal bleeding, and LOF. Denies headache, vision changes,and RUQ pain. Mother and significant other at bedside. Patient planning a waterbirth.   Prenatal History: [id number]   EDC : 04/15/2021, by Ultrasound  Prenatal care at [facility]  Prenatal course complicated by:  -Late prenatal care  Prenatal Labs: Nursing Staff Provider  Office Location  [facility] Dating  20w [place]  Language  English Anatomy US  Completed  Flu Vaccine  Declined 12/30/20 Genetic/Carrier Screen  NIPS:    AFP:    [place]:  TDaP Vaccine   02/01/21 Hgb A1C or  GTT Early  Third trimester  Component Ref Range & Units 2 mo ago  Glucose, Fasting 70 - 91 mg/dL 78   Glucose, 1 hour 70 - 179 mg/dL 254   Glucose, 2 hour 70 - 152 mg/dL 91        COVID Vaccine Declined   LAB RESULTS   Rhogam  N/A Blood Type O/Positive/-- (11/02 1642)   Baby Feeding Plan Breast/Bottle Antibody Negative (11/02 1642)  Contraception  IUD?? Rubella 3.20 (11/02 1642)  Circumcision If boy, yes RPR Non Reactive (11/02 1642)   Pediatrician  [place] HBsAg Negative (11/02 1642)   Support Person Antonio HCVAb    Prenatal Classes   HIV Non Reactive (11/02 1642)     BTL Consent N/A GBS Negative/-- (01/19 1111) (For PCN allergy, check sensitivities)   VBAC Consent N/A Pap             DME Rx [ ]  BP cuff [ ]  Weight Scale Waterbirth  [ ]  Class [ ]  Consent [ ]  CNM visit  PHQ9 & GAD7 [X]  new OB [  ] 28 weeks  [  ] 36 weeks Induction  [ ]  Orders Entered [ ] Foley Y/N     Maternal Diabetes: No Genetic Screening: Declined Maternal Ultrasounds/Referrals: Normal Fetal Ultrasounds or other Referrals:  None Maternal Substance Abuse:  No Significant Maternal Medications:  None Significant Maternal Lab Results:  Group B Strep  negative  Medical / Surgical History : Past medical history:  Past Medical History:  Diagnosis Date   Chlamydia infection affecting pregnancy in third trimester    Gastritis     Past surgical history:  Past Surgical History:  Procedure Laterality Date   HERNIA REPAIR      Family History:  Family History  Problem Relation Age of Onset   Hypertension Father     Social History:  reports that she has never smoked. She has never used smokeless tobacco. She reports that she does not drink alcohol and does not use drugs. Allergies: Coconut oil   Current Medications at time of admission:  Medications Prior to Admission  Medication Sig Dispense Refill Last Dose   Prenatal Vit-Fe Fumarate-FA (PREPLUS) 27-1 MG TABS Take 1 tablet by mouth daily. 30 tablet 13 04/14/2021   ondansetron (ZOFRAN-ODT) 4 MG disintegrating tablet Take 1 tablet (4 mg total) by mouth every 8 (eight) hours as needed for nausea or vomiting. (Patient not taking: Reported on 04/07/2021) 15 tablet 0      Review of Systems: Review of Systems  Constitutional: Negative.   HENT: Negative.    Eyes: Negative.   Respiratory: Negative.    Cardiovascular: Negative.   Musculoskeletal: Negative.   Neurological: Negative.  Psychiatric/Behavioral: Negative.     Physical Exam: Vital signs and nursing notes reviewed.  Patient Vitals for the past 24 hrs:  BP Temp Temp src Pulse Resp SpO2  04/15/21 0650 121/76 -- -- 80 18 --  04/15/21 0625 126/70 -- -- 68 -- --  04/15/21 0622 126/70 -- -- 68 -- 100 %  04/15/21 0619 (!) 144/72 97.8 F (36.6 C) Oral 81 18 100 %     General: AAO x 3, NAD Heart: RRR Lungs:CTAB Abdomen: Gravid, NT Extremities: no edema Genitalia / VE: Dilation: 6 Effacement (%): 80 Cervical Position: Middle Station: -1 Presentation: Vertex Exam by:: Georgina Snell, RN   FHR: 125 BPM, moderate variability, present accels, absent decels TOCO: Contractions Q 2-5 minutes   Labs:   Pending T&S, CBC,  RPR  No results for input(s): WBC, HGB, HCT, PLT in the last 72 hours.  Assessment:  26 y.o. [id number] at [redacted]w[redacted]d here for spontaneous labor   1. 1st stage of labor 2. FHR category 1 3. GBS negative 4. Desires waterbirth, consent and class completed 5. Plans to breastfeed  Plan:  1. Admit to BS 2. Routine L&D orders 3. Analgesia/anesthesia PRN  4. Expectant management 5. Anticipate NSVB 6. No postpartum contraception  7. Planning circ  Trellis Moment, SNM 04/15/2021, 6:36 AM   CNM attestation:  I have seen and examined this patient; I agree with above documentation in the student nurse midwife's note.   Erica Horton is a 26 y.o. [phone] here for SOL  PE: BP 121/76 (BP Location: Right Arm)    Pulse 80    Temp 97.8 F (36.6 C) (Oral)    Resp 18    LMP 08/28/2020 (Approximate)    SpO2 100%  Gen: breathing w ctx Resp: normal effort, no distress Abd: gravid  ROS, labs, PMH reviewed  Plan: Admit to Labor and Delivery Expectant management Desires waterbirth Anticipate vag del  [name] CNM 04/15/2021, 8:48 AM

## 2021-04-15 NOTE — Discharge Summary (Signed)
Postpartum Discharge Summary     Patient Name: Erica Horton DOB: May 03, 1995 MRN: [id number]  Date of admission: 04/15/2021 Delivery date:04/15/2021  Delivering provider: Christin Fudge  Date of discharge: 04/17/2021  Admitting diagnosis: Labor and delivery indication for care or intervention [O75.9] Intrauterine pregnancy: [redacted]w[redacted]d    Secondary diagnosis:  Principal Problem:   Labor and delivery indication for care or intervention Active Problems:   Postpartum care following vaginal delivery  Additional problems: None    Discharge diagnosis: Term Pregnancy Delivered                                              Post partum procedures: None Augmentation: none Complications: None  Hospital course: Onset of Labor With Vaginal Delivery      26y.o. yo [id number] 43w0das admitted in Active Labor on 04/15/2021. Patient had an uncomplicated labor course as follows:  Membrane Rupture Time/Date: 8:11 AM ,04/15/2021   Delivery Method:Vaginal, Spontaneous  Episiotomy: None  Lacerations:  Labial;1st degree;Perineal  Patient had an uncomplicated postpartum course.  She is ambulating, tolerating a regular diet, passing flatus, and urinating well. Patient is discharged home in stable condition on 04/17/21.  Newborn Data: Birth date:04/15/2021  Birth time:8:13 AM  Gender:Female  Living status:Living  Apgars:9 ,9  Weight:3280 g   Magnesium Sulfate received: No BMZ received: No Rhophylac:N/A MMR:N/A T-DaP:Given prenatally Flu: No Transfusion:No  Physical exam  Vitals:   04/16/21 1312 04/16/21 1515 04/16/21 1954 04/17/21 0521  BP: 114/63 122/86 111/65 121/68  Pulse: 67 69 71 70  Resp: '18 17 18 18  ' Temp: 98.2 F (36.8 C) 97.6 F (36.4 C) 98.3 F (36.8 C) 97.9 F (36.6 C)  TempSrc: Oral Oral Oral Oral  SpO2: 100% 99% 99% 100%   General: alert, cooperative, and no distress Lochia: appropriate Uterine Fundus: firm DVT Evaluation: No evidence of DVT seen on physical  exam. Negative Homan's sign. No cords or calf tenderness. Labs: Lab Results  Component Value Date   WBC 8.2 04/15/2021   HGB 12.7 04/15/2021   HCT 37.2 04/15/2021   MCV 88.8 04/15/2021   PLT 202 04/15/2021   No flowsheet data found. Edinburgh Score: Edinburgh Postnatal Depression Scale Screening Tool 04/15/2021  I have been able to laugh and see the funny side of things. 0  I have looked forward with enjoyment to things. 0  I have blamed myself unnecessarily when things went wrong. 0  I have been anxious or worried for no good reason. 0  I have felt scared or panicky for no good reason. 0  Things have been getting on top of me. 1  I have been so unhappy that I have had difficulty sleeping. 0  I have felt sad or miserable. 0  I have been so unhappy that I have been crying. 0  The thought of harming myself has occurred to me. 0  Edinburgh Postnatal Depression Scale Total 1     After visit meds:  Allergies as of 04/17/2021       Reactions   Coconut Oil Rash        Medication List     STOP taking these medications    ondansetron 4 MG disintegrating tablet Commonly known as: ZOFRAN-ODT       TAKE these medications    ibuprofen 600 MG tablet Commonly known as: ADVIL Take 1 tablet (  600 mg total) by mouth every 6 (six) hours as needed for mild pain, moderate pain or cramping.   PrePLUS 27-1 MG Tabs Take 1 tablet by mouth daily.               Durable Medical Equipment  (From admission, onward)           Start     Ordered   04/15/21 1108  For home use only DME double electric breast pump  Once       Comments: ICD-10 code Z39.1   04/15/21 1108             Discharge home in stable condition Infant Feeding: Breast Infant Disposition:home with mother Discharge instruction: per After Visit Summary and Postpartum booklet. Activity: Advance as tolerated. Pelvic rest for 6 weeks.  Diet: routine diet  Follow up Visit: Please schedule this  patient for a Virtual postpartum visit in 4 weeks with the following provider: Any provider. Additional Postpartum F/U:  Low risk pregnancy complicated by:  Delivery mode:  Vaginal, Spontaneous  Anticipated Birth Control:   none   04/17/2021 Verita Schneiders, MD

## 2021-04-15 NOTE — MAU Note (Signed)
..  Erica Horton is a 26 y.o. at [redacted]w[redacted]d here in MAU reporting: CTX since yesterday afternoon, and they got more intense around 0200 am this am. PT denies DFM, VB, LOF, PIH s/s, and complications in the pregnancy. Pt reports some discharge.  Desires a waterbirth  SVE yesterday 3cm  GBS neg Onset of complaint: 0200 Pain score: 8/10  There were no vitals filed for this visit.   FHT:130 Lab orders placed from triage:   none

## 2021-04-15 NOTE — Lactation Note (Signed)
This note was copied from a baby's chart. Lactation Consultation Note  Patient Name: Erica Horton [id number] Date: 04/15/2021 Reason for consult: Initial assessment;Term Age:25 hours   P3 mother whose infant is now 76 hours old.  This is a term baby at 40+0 weeks.  Mother breast fed her first child (now 32 years old) for 2-3 months and her second child (now 16 months) for 8-9 months.  Her current feeding preference is breast.  RN in room assisting mother when I arrived; mother has yet to complete her breakfast.  Reviewed breast feeding basics.  Mother reported that RN assisted with hand expression and she can easily obtain colostrum.  Encouraged practice with hand expression before/after feedings to help ensure a good milk supply.  Mother will feed 8-12 times/24 hours or sooner if baby shows cues.  Mother reported that she plans to awaken baby after finishing breakfast.  Asked her to call for latch assistance if needed.    Mother interested in obtaining a Stork pump; RN entered order.  Will follow through with this.  Support person present.   Maternal Data Has patient been taught Hand Expression?: Yes Does the patient have breastfeeding experience prior to this delivery?: Yes How long did the patient breastfeed?: 2-3 months with first child and 8-9 months with second child  Feeding Mother's Current Feeding Choice: Breast Milk  LATCH Score                    Lactation Tools Discussed/Used    Interventions Interventions: Breast feeding basics reviewed;Education  Discharge    Consult Status Consult Status: Follow-up Date: 04/16/21 Follow-up type: In-patient    Krist Rosenboom R Amir Glaus 04/15/2021, 11:32 AM

## 2021-04-16 LAB — RPR: RPR Ser Ql: NONREACTIVE

## 2021-04-16 NOTE — Lactation Note (Signed)
This note was copied from a baby's chart. Lactation Consultation Note  Patient Name: Erica Horton [id number] Date: 04/16/2021 Reason for consult: Follow-up assessment;Term;Infant weight loss (-7% weight loss) Age:25 hours P3, term female infant with -7% weight loss. Per mom, she has only been latching infant on one breast per feeding and she was not always doing skin to skin when feeding infant. LC entered the room, mom had infant latched on her right breast, LC observed infant's body was turned away from mom, latch was shallow infant did not have mom's whole nipple in his mouth. Mom was open to trying a different BF position. LC discussed with mom to latch infant on both breast and to do breast stimulation techniques to keep infant awake while feeding such as: breast compressions, gently stroking infant neck, shoulder and hands, talking to infant. Mom latched infant on her left breast using the cross cradle hold, infant BF for 15 minutes and then was switched to football hold on mom's right breast, infant was latched with depth, swallows heard, infant BF for total of 20 minutes. Afterwards mom was given hand pump with 27 breast flange by PCP2  [facility] student demonstrated how to use hand pump. Mom pumped 8 mls of Colostrum that was given to infant by curve tip syringe. Mom understands that EBM is good at room temp for 4 hours. Mom knows to call RN/LC if she has any BF questions, concerns or need further assistance with latching infant at the breast.  Mom's plan: 1- Mom will continue to breastfeed infant according to hunger cues, 8 to 12+ or more times within 24 hours. 2- Mom will attempt to latch infant on both breast during a feeding, skin to skin and do breast stimulation techniques to keep infant awake while breastfeeding. 3- Mom plans to use hand pump and supplement infant with  her EBM, using a curve tip syringe with every feeding to help stabilize infant's weight. 4- Mom was given a  breastfeeding supplemental sheet and on Day 2 mom can give infant 7-12 mls per feeding.  Maternal Data    Feeding Mother's Current Feeding Choice: Breast Milk  LATCH Score Latch: Grasps breast easily, tongue down, lips flanged, rhythmical sucking.  Audible Swallowing: Spontaneous and intermittent  Type of Nipple: Everted at rest and after stimulation  Comfort (Breast/Nipple): Soft / non-tender  Hold (Positioning): Assistance needed to correctly position infant at breast and maintain latch.  LATCH Score: 9   Lactation Tools Discussed/Used Tools: Pump Breast pump type: Manual Pump Education: Milk Storage;Setup, frequency, and cleaning Reason for Pumping: Mom using hand pump to give back any EBM that is pumped after latching infant at breast to help stablize weight loss. Pumping frequency: Mom plans to use hand pump after latching infant at the breast. Pumped volume: 8 mL  Interventions Interventions: Assisted with latch;Skin to skin;Breast compression;Adjust position;Support pillows;Position options;Expressed milk;DEBP;Education  Discharge    Consult Status Consult Status: Follow-up Date: 04/17/21 Follow-up type: In-patient    Danelle Earthly 04/16/2021, 6:56 PM

## 2021-04-16 NOTE — Lactation Note (Signed)
This note was copied from a baby's chart. Lactation Consultation Note  Patient Name: Erica Horton [id number] Date: 04/16/2021 Reason for consult: Follow-up assessment Age:25 hours   P3 mother whose infant is now 73 hours old.  This is a term baby at 40+0 weeks.  Weight loss was 7% at < 24 hours after birth.  Baby was asleep on mother's chest when I arrived; returned recently from his circumcision.  Mother has been breast feeding with LATCH scores of 9; voiding/stooling well.  Encouraged to feed at least every three hours due to early weight loss and to consider supplementation today/tonight if baby does not wake up and breast feed.  Mother had no questions/concerns related to breast feeding and will call as needed for latch assistance.  Father present and asleep on the couch.   Maternal Data    Feeding    LATCH Score                    Lactation Tools Discussed/Used    Interventions    Discharge    Consult Status Consult Status: Follow-up Date: 04/17/21 Follow-up type: In-patient    Lynnox Girten R Yvetta Drotar 04/16/2021, 2:01 PM

## 2021-04-16 NOTE — Progress Notes (Signed)
Post Partum Day 1 Subjective: Erica Horton is a 26 y.o. [id number] [redacted]w[redacted]d s/p SVD.  No acute events overnight.  Pt denies problems with ambulating, voiding or po intake.  She denies nausea or vomiting.  Pain is well controlled.  She has had flatus.  Lochia Small.  Plan for birth control is no method.  Method of Feeding: breast.  Objective: Blood pressure 114/63, pulse 67, temperature 98.2 F (36.8 C), temperature source Oral, resp. rate 18, last menstrual period 08/28/2020, SpO2 100 %, unknown if currently breastfeeding.  Physical Exam:  General: alert, cooperative and no distress Lochia:normal flow Lungs: CTAB Heart: RRR w/o M/G/R Abdomen: +BS, soft, mild TTP (appropriate) Uterine Fundus: firm, 3 FB umbilicus DVT Evaluation: No evidence of DVT seen on physical exam. Extremities: no edema  Recent Labs    04/15/21 0635  HGB 12.7  HCT 37.2    Assessment/Plan: ASSESSMENT: Erica Horton is a 26 y.o. G9E0100 [redacted]w[redacted]d s/p SVD. Desires discharge home.  VSS and normal H/H.  Plan for discharge tomorrow, Breastfeeding, and Contraception declined hormonal contraception, consider natural family planning. Discussed optimal birth spacing and pt verbalized understanding.  Continue routine PP care Breastfeeding support PRN  LOS: 1 day   [facility] Juliana Boling 04/16/2021, 1:29 PM

## 2021-04-17 MED ORDER — IBUPROFEN 600 MG PO TABS
600.0000 mg | ORAL_TABLET | Freq: Four times a day (QID) | ORAL | 2 refills | Status: DC | PRN
Start: 2021-04-17 — End: 2023-01-15

## 2021-04-17 NOTE — Lactation Note (Signed)
This note was copied from a baby's chart. Lactation Consultation Note  Patient Name: Erica Horton [id number] Date: 04/17/2021 Reason for consult: Follow-up assessment Age:25 hours   P3 mother whose infant is now 72 hours old.  This is a term baby at 40+0 weeks.  Mother breast fed her first child (now 20 years old) for 2-3 months and her second child (now 54 months old) for 8-9 months.  Her current feeding preference is breast.  Baby was asleep STS on mother's chest when I arrived.  She has been breast feeding well; last LATCH score was a 9.  Baby is voiding/stooling.  Mother continues to do hand expression and has also used the manual pump for stimulation.  She is feeding back EBM to baby.  Mother has no further questions/concerns related to breast feeding.  RN entered order for a Stork pump two days ago.  Mother has still not received this pump.  Will call when office opens this morning.   Maternal Data    Feeding    LATCH Score                    Lactation Tools Discussed/Used    Interventions Interventions: Breast feeding basics reviewed;Education  Discharge Discharge Education: Engorgement and breast care Pump: Stork Pump (RN put in order two days ago; will fax and call again today for [facility] pump)  Consult Status Consult Status: Complete Date: 04/17/21 Follow-up type: Call as needed    Irene Pap Sharnita Bogucki 04/17/2021, 8:39 AM

## 2021-04-19 ENCOUNTER — Telehealth: Payer: Self-pay | Admitting: Family Medicine

## 2021-04-19 ENCOUNTER — Other Ambulatory Visit: Payer: 59

## 2021-04-19 NOTE — Telephone Encounter (Signed)
Called patient to inform of appointment change, there was no answer to the phone call so a voicemail was left with the call back number for the office.  

## 2021-04-22 ENCOUNTER — Inpatient Hospital Stay (HOSPITAL_COMMUNITY): Admission: RE | Admit: 2021-04-22 | Payer: 59 | Source: Ambulatory Visit | Admitting: Obstetrics & Gynecology

## 2021-04-26 ENCOUNTER — Telehealth (HOSPITAL_COMMUNITY): Payer: Self-pay | Admitting: *Deleted

## 2021-04-26 NOTE — Telephone Encounter (Signed)
Phone voicemail message left to return nurse call.  Duffy Rhody, RN 04-26-2021 at 9:29am

## 2021-05-14 ENCOUNTER — Telehealth: Payer: 59 | Admitting: Obstetrics & Gynecology

## 2021-05-17 ENCOUNTER — Telehealth: Payer: Self-pay

## 2021-05-17 ENCOUNTER — Encounter: Payer: Self-pay | Admitting: Family Medicine

## 2021-05-17 ENCOUNTER — Telehealth (INDEPENDENT_AMBULATORY_CARE_PROVIDER_SITE_OTHER): Payer: 59 | Admitting: Family Medicine

## 2021-05-17 NOTE — Progress Notes (Signed)
? ? ? ?  Provider location: [facility] [employer] [facility]  ? ?Patient location: Home ? ?I connected with Erica Horton on 05/17/21 at  2:15 PM EDT by MyChart Video Encounter and verified that I am speaking with the correct person using two identifiers. I discussed the limitations, risks, security and privacy concerns of performing an evaluation and management service virtually and the availability of in person appointments. I also discussed with the patient that there may be a patient responsible charge related to this service. The patient expressed understanding and agreed to proceed. ? ?Post Partum Visit Note ? ?Erica Horton is a 26 y.o. [phone] female who presents for a postpartum visit. She is 4 weeks postpartum following a normal spontaneous vaginal delivery.  I have fully reviewed the prenatal and intrapartum course. The delivery was at 40 gestational weeks.  Anesthesia: none. Postpartum course has been good. Baby is doing well. Baby is feeding by breast. Bleeding  "not a lot, on and off" . Bowel function is normal. Bladder function is normal. Patient is not sexually active. Contraception method is abstinence and condoms. Postpartum depression screening: negative. ? ? ?The pregnancy intention screening data noted above was reviewed. Potential methods of contraception were discussed. The patient elected to proceed with No data recorded. ? ? ? ?Health Maintenance Due  ?Topic Date Due  ? COVID-19 Vaccine (1) Never done  ? HPV VACCINES (1 - 2-dose series) Never done  ? PAP-Cervical Cytology Screening  Never done  ? PAP SMEAR-Modifier  Never done  ? INFLUENZA VACCINE  Never done  ? ? ?Review of Systems ?Pertinent items are noted in HPI. ? ?Objective:  ?There were no vitals taken for this visit.  ? ?General:  alert, cooperative, and no distress  ? Breasts:   NA  ?Lungs: Normal WOB   ?Heart:   NA  ?Abdomen:  NA  ?Neuro  Alert and oriented. Normal speech. Engages in conversation.   ?GU exam:   NA  ?     ?Assessment:  ? ? 1. Postpartum care and examination, doing well  ? ?Plan:  ? ?Essential components of care per ACOG recommendations: ? ?1.  Mood and well being: Patient with negative depression screening today.  ? ?2. Infant care and feeding:  ?-Patient currently breastmilk feeding? Yes  ? ?3. Sexuality, contraception and birth spacing ?- Patient does not want a pregnancy in the next year.   ?- Reviewed reproductive life planning. Reviewed contraceptive methods based on pt preferences and effectiveness.  Patient desired Abstinence today.   ?- Discussed birth spacing of 18 months ? ?4. Sleep and fatigue ?-Encouraged family/partner/community support of 4 hrs of uninterrupted sleep to help with mood and fatigue ? ?5. Physical Recovery  ?- Discussed patients delivery and complications. She describes her labor as good. ?- Patient had a  waterbirth . Patient had a 1st degree laceration. Perineal healing reviewed. Patient expressed understanding ?- Patient has urinary incontinence? No. ?- Patient is safe to resume physical and sexual activity in next two weeks.  ? ?6.  Health Maintenance ?- HM due items addressed yes, due for pap smear. Sent message.  ? ?7. Chronic Disease/Pregnancy Condition follow up: None ? ?Follow up for pap smear.  ? ?Erica Stack, DO ?[facility] [employer], [facility] ?

## 2021-05-17 NOTE — Telephone Encounter (Signed)
!  st attempt to get in contact with patient in regards to today's virtual visit. Left Hipaa compliant VM along with office call back number. ? ? ?Leeroy Lovings, CMA  ? ?05/17/21 ? ?11:20 ?

## 2022-12-29 ENCOUNTER — Inpatient Hospital Stay (HOSPITAL_COMMUNITY)
Admission: AD | Admit: 2022-12-29 | Discharge: 2022-12-29 | Discharge status: Against Medical Advice | Payer: 59 | Attending: Obstetrics and Gynecology | Admitting: Obstetrics and Gynecology

## 2022-12-29 ENCOUNTER — Encounter (HOSPITAL_COMMUNITY): Payer: Self-pay

## 2022-12-29 DIAGNOSIS — Z3A27 27 weeks gestation of pregnancy: Secondary | ICD-10-CM | POA: Diagnosis not present

## 2022-12-29 DIAGNOSIS — O26892 Other specified pregnancy related conditions, second trimester: Secondary | ICD-10-CM | POA: Insufficient documentation

## 2022-12-29 DIAGNOSIS — R102 Pelvic and perineal pain: Secondary | ICD-10-CM | POA: Diagnosis present

## 2022-12-29 NOTE — MAU Note (Signed)
..  Erica Horton is a 27 y.o. at [redacted]w[redacted]d here in MAU reporting: Light pink vaginal spotting around midnight. None now. She reports ongoing lower back pain and ongoing pelvic pain. Last IC this past Sunday/Monday. Denies vaginal itching and vaginal odors. Denies LOF. +FM.   No PNC. Received an [place] with the [facility] and they gave her an EDD of 03/30/2023.  Has her two younger children with her. Reports she would have to leave and come back.  LMP: 06/23/2022 Onset of complaint: midnight Pain score:  5/10 back 1/10 pelvic pain   FHT: 141 initial external Lab orders placed from triage:  UA

## 2022-12-29 NOTE — MAU Note (Signed)
Patient states she needs to leave now to take her children to her friends house and will return. MD made aware. Patient signed AMA form.

## 2023-01-15 ENCOUNTER — Encounter (HOSPITAL_COMMUNITY): Payer: Self-pay | Admitting: Obstetrics & Gynecology

## 2023-01-15 ENCOUNTER — Inpatient Hospital Stay (HOSPITAL_BASED_OUTPATIENT_CLINIC_OR_DEPARTMENT_OTHER): Payer: Medicaid Other

## 2023-01-15 ENCOUNTER — Inpatient Hospital Stay (HOSPITAL_COMMUNITY)
Admission: AD | Admit: 2023-01-15 | Discharge: 2023-01-15 | Disposition: A | Discharge status: Home / Self Care | Payer: Medicaid Other | Attending: Obstetrics & Gynecology | Admitting: Obstetrics & Gynecology

## 2023-01-15 DIAGNOSIS — B3731 Acute candidiasis of vulva and vagina: Secondary | ICD-10-CM | POA: Diagnosis not present

## 2023-01-15 DIAGNOSIS — Z3A29 29 weeks gestation of pregnancy: Secondary | ICD-10-CM

## 2023-01-15 DIAGNOSIS — O4693 Antepartum hemorrhage, unspecified, third trimester: Secondary | ICD-10-CM | POA: Diagnosis present

## 2023-01-15 DIAGNOSIS — O26893 Other specified pregnancy related conditions, third trimester: Secondary | ICD-10-CM | POA: Diagnosis not present

## 2023-01-15 DIAGNOSIS — O23593 Infection of other part of genital tract in pregnancy, third trimester: Secondary | ICD-10-CM | POA: Diagnosis not present

## 2023-01-15 DIAGNOSIS — A599 Trichomoniasis, unspecified: Secondary | ICD-10-CM | POA: Diagnosis not present

## 2023-01-15 DIAGNOSIS — O0933 Supervision of pregnancy with insufficient antenatal care, third trimester: Secondary | ICD-10-CM

## 2023-01-15 DIAGNOSIS — O98313 Other infections with a predominantly sexual mode of transmission complicating pregnancy, third trimester: Secondary | ICD-10-CM | POA: Diagnosis not present

## 2023-01-15 DIAGNOSIS — Z3490 Encounter for supervision of normal pregnancy, unspecified, unspecified trimester: Secondary | ICD-10-CM

## 2023-01-15 DIAGNOSIS — O98813 Other maternal infectious and parasitic diseases complicating pregnancy, third trimester: Secondary | ICD-10-CM | POA: Diagnosis not present

## 2023-01-15 LAB — DIFFERENTIAL
Abs Immature Granulocytes: 0.02 10*3/uL (ref 0.00–0.07)
Basophils Absolute: 0 10*3/uL (ref 0.0–0.1)
Basophils Relative: 0 %
Eosinophils Absolute: 0.2 10*3/uL (ref 0.0–0.5)
Eosinophils Relative: 2 %
Immature Granulocytes: 0 %
Lymphocytes Relative: 25 %
Lymphs Abs: 2.1 10*3/uL (ref 0.7–4.0)
Monocytes Absolute: 0.6 10*3/uL (ref 0.1–1.0)
Monocytes Relative: 7 %
Neutro Abs: 5.5 10*3/uL (ref 1.7–7.7)
Neutrophils Relative %: 66 %

## 2023-01-15 LAB — URINALYSIS, ROUTINE W REFLEX MICROSCOPIC
Bilirubin Urine: NEGATIVE
Glucose, UA: NEGATIVE mg/dL
Ketones, ur: NEGATIVE mg/dL
Nitrite: NEGATIVE
Protein, ur: NEGATIVE mg/dL
RBC / HPF: >50 RBC/hpf (ref 0–5)
Specific Gravity, Urine: 1.018 (ref 1.005–1.030)
pH: 7 (ref 5.0–8.0)

## 2023-01-15 LAB — CBC
HCT: 35.3 % — ABNORMAL LOW (ref 36.0–46.0)
Hemoglobin: 11.7 g/dL — ABNORMAL LOW (ref 12.0–15.0)
MCH: 30.2 pg (ref 26.0–34.0)
MCHC: 33.1 g/dL (ref 30.0–36.0)
MCV: 91 fL (ref 80.0–100.0)
Platelets: 209 10*3/uL (ref 150–400)
RBC: 3.88 MIL/uL (ref 3.87–5.11)
RDW: 13 % (ref 11.5–15.5)
WBC: 8.4 10*3/uL (ref 4.0–10.5)
nRBC: 0 % (ref 0.0–0.2)

## 2023-01-15 LAB — HEPATITIS B SURFACE ANTIGEN: Hepatitis B Surface Ag: NONREACTIVE

## 2023-01-15 LAB — WET PREP, GENITAL
Clue Cells Wet Prep HPF POC: NONE SEEN
Sperm: NONE SEEN
WBC, Wet Prep HPF POC: >=10 — AB (ref <10)

## 2023-01-15 LAB — HIV ANTIBODY (ROUTINE TESTING W REFLEX): HIV Screen 4th Generation wRfx: NONREACTIVE

## 2023-01-15 LAB — VITAMIN D 25 HYDROXY (VIT D DEFICIENCY, FRACTURES): Vit D, 25-Hydroxy: 9.85 ng/mL — ABNORMAL LOW (ref 30–100)

## 2023-01-15 LAB — TYPE AND SCREEN
ABO/RH(D): O POS
Antibody Screen: NEGATIVE

## 2023-01-15 LAB — HEMOGLOBIN A1C
Hgb A1c MFr Bld: 5.2 % (ref 4.8–5.6)
Mean Plasma Glucose: 102.54 mg/dL

## 2023-01-15 LAB — HEPATITIS C ANTIBODY: HCV Ab: NONREACTIVE

## 2023-01-15 MED ORDER — PREPLUS 27-1 MG PO TABS: 1.0000 | ORAL_TABLET | Freq: Every day | ORAL | 13 refills | Status: AC

## 2023-01-15 MED ORDER — TERCONAZOLE 0.4 % VA CREA: 1.0000 | TOPICAL_CREAM | Freq: Every day | VAGINAL | 0 refills | Status: DC

## 2023-01-15 MED ORDER — METRONIDAZOLE 500 MG PO TABS
2000.0000 mg | ORAL_TABLET | Freq: Once | ORAL | Status: AC
  Administered 2023-01-15: 2000 mg via ORAL
  Filled 2023-01-15: qty 4

## 2023-01-15 MED ORDER — ACETAMINOPHEN 500 MG PO TABS
1000.0000 mg | ORAL_TABLET | Freq: Once | ORAL | Status: AC
  Administered 2023-01-15: 1000 mg via ORAL
  Filled 2023-01-15: qty 2

## 2023-01-15 NOTE — MAU Provider Note (Signed)
Chief Complaint:  Vaginal Bleeding and Back Pain  HPI  Erica Horton is a 27 y.o. [id number] at [redacted]w[redacted]d who presents to maternity admissions reporting an episode of bright red vaginal bleeding upon waking from a nap, denies pain or any other physical symptoms. Woke up to wetness and thought she'd wet herself but it was blood. Got cleaned up and put on a pad, came here.   Pregnancy Course: Has presented here before for vaginal bleeding but had her kids with her and could not stay for the ultrasound. Has previously been seen at [facility] but has not established care yet due to her work schedule and demands of parenting.   Past Medical History:  Diagnosis Date   Chlamydia infection affecting pregnancy in third trimester    Gastritis    OB History  Gravida Para Term Preterm AB Living  5 3 3  0 1 3  SAB IAB Ectopic Multiple Live Births  1     0 3    # Outcome Date GA Lbr Len/2nd Weight Sex Type Anes PTL Lv  5 Current           4 Term 04/15/21 [redacted]w[redacted]d 06:11 / 00:02 7 lb 3.7 oz (3.28 kg) M Vag-Spont None  LIV  3 SAB 06/02/20 [redacted]w[redacted]d         2 Term 10/01/19 [redacted]w[redacted]d 10:48 / 00:07 6 lb 13 oz (3.09 kg) F Vag-Spont EPI  LIV  1 Term 03/03/16 [redacted]w[redacted]d 14:02 / 00:32 7 lb 8.1 oz (3.405 kg) M Vag-Spont EPI  LIV   Past Surgical History:  Procedure Laterality Date   HERNIA REPAIR     Family History  Problem Relation Age of Onset   Hypertension Father    Social History   Tobacco Use   Smoking status: Never   Smokeless tobacco: Never  Vaping Use   Vaping status: Never Used  Substance Use Topics   Alcohol use: No   Drug use: No   Allergies  Allergen Reactions   Coconut (Cocos Nucifera) Rash   Medications Prior to Admission  Medication Sig Dispense Refill Last Dose   [DISCONTINUED] Prenatal Vit-Fe Fumarate-FA (PREPLUS) 27-1 MG TABS Take 1 tablet by mouth daily. 30 tablet 13 Past Month   ibuprofen (ADVIL) 600 MG tablet Take 1 tablet (600 mg total) by mouth every 6 (six) hours as needed for mild pain, moderate  pain or cramping. 30 tablet 2 Unknown   I have reviewed patient's Past Medical Hx, Surgical Hx, Family Hx, Social Hx, medications and allergies.   ROS  Pertinent items noted in HPI and remainder of comprehensive ROS otherwise negative.   PHYSICAL EXAM  Patient Vitals for the past 24 hrs:  BP Temp Temp src Pulse Resp SpO2 Height Weight  01/15/23 1940 -- -- -- -- -- 100 % -- --  01/15/23 1935 -- -- -- -- -- 100 % -- --  01/15/23 1930 -- -- -- -- -- 100 % -- --  01/15/23 1925 -- -- -- -- -- 100 % -- --  01/15/23 1920 -- -- -- -- -- 100 % -- --  01/15/23 1910 -- -- -- -- -- 100 % -- --  01/15/23 1908 109/64 97.7 F (36.5 C) Oral 70 16 100 % -- --  01/15/23 1808 111/64 -- -- 82 -- -- -- --  01/15/23 1748 114/63 98.4 F (36.9 C) Oral 79 18 100 % 5\' 11"  (1.803 m) 166 lb 11.2 oz (75.6 kg)   Constitutional: Well-developed, well-nourished female in no  acute distress.  Cardiovascular: normal rate & rhythm, warm and well-perfused Respiratory: normal effort, no problems with respiration noted GI: Abd soft, non-tender, non-distended MS: Extremities nontender, no edema, normal ROM Neurologic: Alert and oriented x 4.  GU: no CVA tenderness Pelvic: NEFG, only scant blood noted on pad  Fetal Tracing: reactive for gestational age Baseline: 130 Variability: minimal but appropriate for gestational age Accelerations: 10x10 Decelerations: occasional variable Toco: relaxed   Labs: Results for orders placed or performed during the hospital encounter of 01/15/23 (from the past 24 hour(s))  Urinalysis, Routine w reflex microscopic -Urine, Clean Catch     Status: Abnormal   Collection Time: 01/15/23  6:30 PM  Result Value Ref Range   Color, Urine YELLOW YELLOW   APPearance HAZY (A) CLEAR   Specific Gravity, Urine 1.018 1.005 - 1.030   pH 7.0 5.0 - 8.0   Glucose, UA NEGATIVE NEGATIVE mg/dL   Hgb urine dipstick MODERATE (A) NEGATIVE   Bilirubin Urine NEGATIVE NEGATIVE   Ketones, ur NEGATIVE  NEGATIVE mg/dL   Protein, ur NEGATIVE NEGATIVE mg/dL   Nitrite NEGATIVE NEGATIVE   Leukocytes,Ua LARGE (A) NEGATIVE   RBC / HPF >50 0 - 5 RBC/hpf   WBC, UA 6-10 0 - 5 WBC/hpf   Bacteria, UA RARE (A) NONE SEEN   Squamous Epithelial / HPF 0-5 0 - 5 /HPF   Mucus PRESENT   Wet prep, genital     Status: Abnormal   Collection Time: 01/15/23  6:55 PM  Result Value Ref Range   Yeast Wet Prep HPF POC PRESENT (A) NONE SEEN   Trich, Wet Prep PRESENT (A) NONE SEEN   Clue Cells Wet Prep HPF POC NONE SEEN NONE SEEN   WBC, Wet Prep HPF POC >=10 (A) <10   Sperm NONE SEEN   Type and screen [facility]     Status: None (Preliminary result)   Collection Time: 01/15/23  6:58 PM  Result Value Ref Range   ABO/RH(D) PENDING    Antibody Screen PENDING    Sample Expiration      01/18/2023,2359 Performed at [facility], [address] [place], [place] [address]   CBC     Status: Abnormal   Collection Time: 01/15/23  7:03 PM  Result Value Ref Range   WBC 8.4 4.0 - 10.5 K/uL   RBC 3.88 3.87 - 5.11 MIL/uL   Hemoglobin 11.7 (L) 12.0 - 15.0 g/dL   HCT 84.6 (L) 96.2 - 95.2 %   MCV 91.0 80.0 - 100.0 fL   MCH 30.2 26.0 - 34.0 pg   MCHC 33.1 30.0 - 36.0 g/dL   RDW 84.1 32.4 - 40.1 %   Platelets 209 150 - 400 K/uL   nRBC 0.0 0.0 - 0.2 %  Differential     Status: None   Collection Time: 01/15/23  7:03 PM  Result Value Ref Range   Neutrophils Relative % 66 %   Neutro Abs 5.5 1.7 - 7.7 K/uL   Lymphocytes Relative 25 %   Lymphs Abs 2.1 0.7 - 4.0 K/uL   Monocytes Relative 7 %   Monocytes Absolute 0.6 0.1 - 1.0 K/uL   Eosinophils Relative 2 %   Eosinophils Absolute 0.2 0.0 - 0.5 K/uL   Basophils Relative 0 %   Basophils Absolute 0.0 0.0 - 0.1 K/uL   Immature Granulocytes 0 %   Abs Immature Granulocytes 0.02 0.00 - 0.07 K/uL  Hemoglobin A1c     Status: None  Collection Time: 01/15/23  7:03 PM  Result Value Ref Range   Hgb A1c MFr Bld 5.2 4.8 - 5.6 %   Mean Plasma Glucose  102.54 mg/dL   Imaging:  [place] MFM OB COMP + 14 WK  Result Date: 01/15/2023 ----------------------------------------------------------------------  OBSTETRICS REPORT                       (Signed Final 01/15/2023 07:34 pm) ---------------------------------------------------------------------- Patient Info  ID #:       [id number]                          D.O.B.:  Jul 19, 1995 (27 yrs)  Name:       Dante Gang                 Visit Date: 01/15/2023 06:56 pm ---------------------------------------------------------------------- Performed By  Attending:        Noralee Space MD        Referred By:       [facility] MAU/Triage  Performed By:     Isabel Caprice        [facility] ---------------------------------------------------------------------- Orders  #  Description                           Code        Ordered By  1  [place] MFM OB COMP + 14 WK                25366.44    Islam Eichinger ----------------------------------------------------------------------  #  Order #                     Accession #                Episode #  1  [id number]                   [id number]                 [id number] ---------------------------------------------------------------------- Indications  [redacted] weeks gestation of pregnancy                 Z3A.29  Vaginal bleeding in pregnancy, third trimester  O46.93 ---------------------------------------------------------------------- Fetal Evaluation  Num Of Fetuses:          1  Fetal Heart Rate(bpm):   143  Cardiac Activity:        Observed  Presentation:            Cephalic  Placenta:                Fundal  Amniotic Fluid  AFI FV:      Within normal limits  AFI Sum(cm)     %Tile       Largest Pocket(cm)  13.1            38          4.2  RUQ(cm)       RLQ(cm)       LUQ(cm)  LLQ(cm)  2.1           4             4.2            2.8 ----------------------------------------------------------------------  Biometry  BPD:      73.3  mm     G. Age:  29w 3d         37  %    CI:        74.53   %    70 - 86                                                          FL/HC:       20.8  %    19.6 - 20.8  HC:      269.5  mm     G. Age:  29w 3d         16  %    HC/AC:       1.04       0.99 - 1.21  AC:      258.3  mm     G. Age:  30w 0d         62  %    FL/BPD:      76.5  %    71 - 87  FL:       56.1  mm     G. Age:  29w 4d         37  %    FL/AC:       21.7  %    20 - 24  CER:      35.1  mm     G. Age:  29w 3d         47  %  Est. FW:    1444   gm     3 lb 3 oz     48  % ---------------------------------------------------------------------- OB History  Gravidity:    5         Term:   3        Prem:   1 ---------------------------------------------------------------------- Gestational Age  Clinical EDD:  29w 3d                                        EDD:   03/30/23  U/S Today:     29w 4d                                        EDD:   03/29/23  Best:          29w 3d     Det. By:  Clinical EDD             EDD:   03/30/23 ---------------------------------------------------------------------- Anatomy  Cranium:               Appears normal         Aortic Arch:            Not well visualized  Cavum:  Appears normal         Ductal Arch:            Not well visualized  Ventricles:            Appears normal         Diaphragm:              Appears normal  Choroid Plexus:        Appears normal         Stomach:                Appears normal, left                                                                        sided  Cerebellum:            Appears normal         Abdomen:                Appears normal  Posterior Fossa:       Appears normal         Abdominal Wall:         Appears nml (cord                                                                        insert, abd wall)  Face:                  Appears normal         Cord Vessels:           Appears normal (3                         (orbits and profile)                            vessel cord)  Lips:                  Appears normal         Kidneys:                Appear normal  Palate:                Not well visualized    Bladder:                Appears normal  Thoracic:              Appears normal         Spine:                  Appears normal  Heart:                 Appears normal         Upper Extremities:      Not well visualized                         (  4CH, axis, and                         situs)  RVOT:                  Not well visualized    Lower Extremities:      Not well visualized  LVOT:                  Not well visualized ---------------------------------------------------------------------- Impression  Patient presented to the MAU with vaginal bleeding. She had  inadequate or no prenatal care.  Fetal growth is appropriate for gestational age determined by  previously-established EDD. Amniotic fluid is normal and  good fetal activity is seen. Fetal anatomical survey is very  limited because of advanced gestational age and fetal  position.  Placenta appears normal with no evidence of retroplacental  hemorrhage. No placenta previa is seen. Ultrasound has  limitations in diagnosing placental abruption. ----------------------------------------------------------------------                  Noralee Space, MD Electronically Signed Final Report   01/15/2023 07:34 pm ----------------------------------------------------------------------    MDM & MAU COURSE  MDM: High  MAU Course: Orders Placed This Encounter  Procedures   Wet prep, genital   Culture, OB Urine   [place] MFM OB COMP + 14 WK   Urinalysis, Routine w reflex microscopic -Urine, Clean Catch   Hepatitis B surface antigen   Rubella screen   RPR   CBC   Differential   HIV Antibody (routine testing w rflx)   Hepatitis C antibody   Hemoglobin A1c   VITAMIN D 25 Hydroxy (Vit-D Deficiency, Fractures)   Type and screen [facility]   Meds ordered this encounter  Medications   acetaminophen  (TYLENOL) tablet 1,000 mg   Prenatal Vit-Fe Fumarate-FA (PREPLUS) 27-1 MG TABS    Sig: Take 1 tablet by mouth daily.    Dispense:  30 tablet    Refill:  13   metroNIDAZOLE (FLAGYL) tablet 2,000 mg   Denies pain, but due to report of previous bleeding episode, concerned for vaginal infection or placenta previa. Offered to get her into care at [facility], which she accepted. Will get OB labs/testing today as well as ultrasound to assess for bleeding. New OB visit scheduled for Dec 4.  Ultrasound normal but swabs positive for yeast and trichomoniasis. Results and flagyl given and terconazole sent to pharmacy along with prenatal vitamins. No further bleeding episodes this visit. Stable for discharge home.  ASSESSMENT   1. Vaginal bleeding in pregnancy, third trimester   2. [redacted] weeks gestation of pregnancy   3. Trichomoniasis   4. Yeast vaginitis   5. No prenatal care in current pregnancy in third trimester    PLAN  Discharge home in stable condition with return precautions.     Follow-up Information     [facility] Follow up on 02/01/2023.   Specialty: Obstetrics and Gynecology Why: 8:55am with Edd Arbour, CNM Contact information: [address] [place] [address] [phone]                Allergies as of 01/15/2023       Reactions   Coconut (cocos Nucifera) Rash        Medication List     STOP taking these medications    ibuprofen 600 MG tablet Commonly known as: ADVIL  TAKE these medications    PrePLUS 27-1 MG Tabs Take 1 tablet by mouth daily.      Edd Arbour, CNM, MSN, IBCLC Certified Nurse Midwife, [facility]

## 2023-01-15 NOTE — MAU Note (Signed)
Erica Horton is a 28 y.o. at [redacted]w[redacted]d here in MAU reporting: had some bleeding around 1600. Woke up , thought she had peed on her self and it was blood. Denies recent intercourse.   Having pain across lower back, started a couple wks ago, is on and off.denies any recent falls.  Reports +FM Onset of complaint: around 1600 Pain score: moderate Vitals:   01/15/23 1748  BP: 114/63  Pulse: 79  Resp: 18  Temp: 98.4 F (36.9 C)  SpO2: 100%     FHT:146 Lab orders placed from triage:  urine

## 2023-01-16 LAB — RPR: RPR Ser Ql: NONREACTIVE

## 2023-01-16 LAB — GC/CHLAMYDIA PROBE AMP (~~LOC~~) NOT AT ARMC
Chlamydia: NEGATIVE
Comment: NEGATIVE
Comment: NORMAL
Neisseria Gonorrhea: NEGATIVE

## 2023-01-16 LAB — CULTURE, OB URINE

## 2023-01-17 LAB — RUBELLA SCREEN: Rubella: 2.66 {index} (ref >0.99)

## 2023-01-19 DIAGNOSIS — Z3490 Encounter for supervision of normal pregnancy, unspecified, unspecified trimester: Secondary | ICD-10-CM

## 2023-01-19 DIAGNOSIS — O0933 Supervision of pregnancy with insufficient antenatal care, third trimester: Secondary | ICD-10-CM

## 2023-01-31 NOTE — Progress Notes (Unsigned)
History:   Erica Horton is a 27 y.o. [id number] at [redacted]w[redacted]d by {Ob dating:14516} being seen today for her first obstetrical visit.  Her obstetrical history is significant for {ob risk factors:10154}. Patient {does/does not:19097} intend to breast feed. Pregnancy history fully reviewed.  Patient reports {sx:14538}.      HISTORY: OB History  Gravida Para Term Preterm AB Living  5 3 3  0 1 3  SAB IAB Ectopic Multiple Live Births  1 0 0 0 3    # Outcome Date GA Lbr Len/2nd Weight Sex Type Anes PTL Lv  5 Current           4 Term 04/15/21 [redacted]w[redacted]d 06:11 / 00:02 7 lb 3.7 oz (3.28 kg) M Vag-Spont None  LIV     Name: RASCHELLE, MANJARRES     Apgar1: 9  Apgar5: 9  3 SAB 06/02/20 [redacted]w[redacted]d         2 Term 10/01/19 [redacted]w[redacted]d 10:48 / 00:07 6 lb 13 oz (3.09 kg) F Vag-Spont EPI  LIV     Name: CAMILY, FREID     Apgar1: 9  Apgar5: 9  1 Term 03/03/16 [redacted]w[redacted]d 14:02 / 00:32 7 lb 8.1 oz (3.405 kg) M Vag-Spont EPI  LIV     Name: Navarette,BOY Sher     Apgar1: 9  Apgar5: 9    Last pap smear was done *** and was {Normal/Abnormal Appearance:21344::"normal"}  Past Medical History:  Diagnosis Date   Chlamydia infection affecting pregnancy in third trimester    Gastritis    Past Surgical History:  Procedure Laterality Date   HERNIA REPAIR     Family History  Problem Relation Age of Onset   Hypertension Father    Social History   Tobacco Use   Smoking status: Never   Smokeless tobacco: Never  Vaping Use   Vaping status: Never Used  Substance Use Topics   Alcohol use: No   Drug use: No   Allergies  Allergen Reactions   Coconut (Cocos Nucifera) Rash   Current Outpatient Medications on File Prior to Visit  Medication Sig Dispense Refill   Prenatal Vit-Fe Fumarate-FA (PREPLUS) 27-1 MG TABS Take 1 tablet by mouth daily. 30 tablet 13   terconazole (TERAZOL 7) 0.4 % vaginal cream Place 1 applicator vaginally at bedtime. 45 g 0   No current facility-administered medications on file prior to visit.     Review of Systems Pertinent items noted in HPI and remainder of comprehensive ROS otherwise negative. Physical Exam:  There were no vitals filed for this visit.    Constitutional: Well-developed, well-nourished pregnant female in no acute distress.  HEENT: PERRLA Skin: normal color and turgor, no rash Cardiovascular: normal rate & rhythm, warm and well perfused Respiratory: normal effort, no problems with respiration noted GI: Abd soft, non-distended MS: Extremities nontender, no edema, normal ROM Neurologic: Alert and oriented x 4.  GU: no CVA tenderness Pelvic: NEFG, physiologic discharge, no blood, cervix clean. ***Pap/swabs collected ***Exam deferred  Assessment:    Pregnancy: [id number] Patient Active Problem List   Diagnosis Date Noted   Supervision of low-risk pregnancy 01/19/2023   Late prenatal care affecting pregnancy in third trimester 01/19/2023     Plan:    1. Encounter for supervision of low-risk pregnancy in third trimester ***  2. [redacted] weeks gestation of pregnancy ***  3. Late prenatal care affecting pregnancy in third trimester ***  4. Initial obstetric visit in third trimester ***   - Initial labs drawn. - Continue  prenatal vitamins. - Problem list reviewed and updated. - Genetic Screening discussed, {Blank multiple:19196::"First trimester screen","Quad screen","NIPS"}: {requests/ordered/declines:14581}. - Ultrasound discussed; fetal anatomic survey: {requests/ordered/declines:14581}. - Anticipatory guidance about prenatal visits given including labs, ultrasounds, and testing. - Discussed usage of Babyscripts and virtual visits as additional source of managing and completing prenatal visits in midst of coronavirus and pandemic.   - Encouraged to complete MyChart Registration for her ability to review results, send requests, and have questions addressed.  - The nature of [facility] with multiple MDs  and Advanced Practice Providers was explained to patient; also emphasized that residents, students are part of our team. - Routine obstetric precautions reviewed. Encouraged to seek out care at office or emergency room [facility] MAU preferred) for urgent and/or emergent concerns.  No follow-ups on file.    Future Appointments  Date Time Provider Department Center  02/01/2023  8:55 AM Bernerd Limbo, CNM [facility]    Edd Arbour, MSN, CNM, IBCLC Certified Nurse Midwife, [facility]

## 2023-02-01 ENCOUNTER — Ambulatory Visit: Payer: Medicaid Other | Admitting: Certified Nurse Midwife

## 2023-02-01 ENCOUNTER — Encounter: Payer: Self-pay | Admitting: Certified Nurse Midwife

## 2023-02-01 VITALS — BP 127/73 | HR 89 | Wt 171.1 lb

## 2023-02-01 DIAGNOSIS — O0933 Supervision of pregnancy with insufficient antenatal care, third trimester: Secondary | ICD-10-CM

## 2023-02-01 DIAGNOSIS — Z1332 Encounter for screening for maternal depression: Secondary | ICD-10-CM

## 2023-02-01 DIAGNOSIS — Z3A31 31 weeks gestation of pregnancy: Secondary | ICD-10-CM | POA: Diagnosis not present

## 2023-02-01 DIAGNOSIS — Z3493 Encounter for supervision of normal pregnancy, unspecified, third trimester: Secondary | ICD-10-CM

## 2023-02-03 LAB — URINE CULTURE, OB REFLEX

## 2023-02-03 LAB — CULTURE, OB URINE

## 2023-02-07 ENCOUNTER — Encounter: Payer: Self-pay | Admitting: *Deleted

## 2023-02-09 LAB — PANORAMA PRENATAL TEST FULL PANEL:PANORAMA TEST PLUS 5 ADDITIONAL MICRODELETIONS: FETAL FRACTION: 16.9

## 2023-02-14 ENCOUNTER — Other Ambulatory Visit: Payer: Self-pay

## 2023-02-14 DIAGNOSIS — Z3A33 33 weeks gestation of pregnancy: Secondary | ICD-10-CM

## 2023-02-15 ENCOUNTER — Other Ambulatory Visit: Payer: Self-pay

## 2023-02-15 ENCOUNTER — Ambulatory Visit (INDEPENDENT_AMBULATORY_CARE_PROVIDER_SITE_OTHER): Payer: Medicaid Other | Admitting: Certified Nurse Midwife

## 2023-02-15 ENCOUNTER — Other Ambulatory Visit (INDEPENDENT_AMBULATORY_CARE_PROVIDER_SITE_OTHER): Payer: Medicaid Other

## 2023-02-15 VITALS — BP 109/60 | HR 82 | Wt 169.0 lb

## 2023-02-15 DIAGNOSIS — A599 Trichomoniasis, unspecified: Secondary | ICD-10-CM | POA: Diagnosis not present

## 2023-02-15 DIAGNOSIS — Z3493 Encounter for supervision of normal pregnancy, unspecified, third trimester: Secondary | ICD-10-CM

## 2023-02-15 DIAGNOSIS — Z3A33 33 weeks gestation of pregnancy: Secondary | ICD-10-CM | POA: Diagnosis not present

## 2023-02-15 DIAGNOSIS — O98313 Other infections with a predominantly sexual mode of transmission complicating pregnancy, third trimester: Secondary | ICD-10-CM

## 2023-02-15 DIAGNOSIS — Z8619 Personal history of other infectious and parasitic diseases: Secondary | ICD-10-CM

## 2023-02-15 NOTE — Progress Notes (Unsigned)
   PRENATAL VISIT NOTE  Subjective:  Erica Horton is a 27 y.o. [id number] at [redacted]w[redacted]d being seen today for ongoing prenatal care.  She is currently monitored for the following issues for this low-risk pregnancy and has Supervision of low-risk pregnancy and Late prenatal care affecting pregnancy in third trimester on their problem list.  Patient reports no complaints.   .  .   . Denies leaking of fluid. Denies contractions. Endorses fetal movement.   Began trich tx two days ago d/t insurance issues. Has noticed minimal blood on wiping.   The following portions of the patient's history were reviewed and updated as appropriate: allergies, current medications, past family history, past medical history, past social history, past surgical history and problem list.   Objective:  There were no vitals filed for this visit.  Fetal Status:           General:  Alert, oriented and cooperative. Patient is in no acute distress.  Skin: Skin is warm and dry. No rash noted.   Cardiovascular: Normal heart rate noted  Respiratory: Normal respiratory effort, no problems with respiration noted  Abdomen: Soft, gravid, appropriate for gestational age.        Pelvic: Cervical exam deferred        Extremities: Normal range of motion.     Mental Status: Normal mood and affect. Normal behavior. Normal judgment and thought content.   Assessment and Plan:  Pregnancy: [id number] at [redacted]w[redacted]d 1. Encounter for supervision of low-risk pregnancy in third trimester (Primary) -Patient stable and feeling well. FHR and FH appropriate Maternal vitals wnl.  -Patient with new scant blood on wiping, likely secondary to untreated trichomoniasis infection. See below.   2. [redacted] weeks gestation of pregnancy - Glucose Tolerance, 2 Hours w/1 Hour - Tdap next visit  - Anticipatory guidance about next visits/weeks of pregnancy given.   3. History of trichomoniasis - Began metronidazole two days ago - TOC needed in 3 weeks  - Advised any  increase in bleeding, change in FM  Preterm labor symptoms and general obstetric precautions including but not limited to vaginal bleeding, contractions, leaking of fluid and fetal movement were reviewed in detail with the patient. Please refer to After Visit Summary for other counseling recommendations.   Return in about 2 weeks (around 03/01/2023) for [facility], IN-PERSON.  No future appointments.  Ralene Muskrat, PA-C

## 2023-02-15 NOTE — Progress Notes (Signed)
   PRENATAL VISIT NOTE  Subjective:  Erica Horton is a 27 y.o. [id number] at [redacted]w[redacted]d being seen today for ongoing prenatal care.  She is currently monitored for the following issues for this low-risk pregnancy and has Supervision of low-risk pregnancy and Late prenatal care affecting pregnancy in third trimester on their problem list.  Patient reports no complaints.  Contractions: Not present. Vag. Bleeding: Other (Spotting).  Movement: Present. Denies leaking of fluid.   Patient with two days of minimal blood on wiping. Began trichomoniasis tx two days ago due to insurance issues.   The following portions of the patient's history were reviewed and updated as appropriate: allergies, current medications, past family history, past medical history, past social history, past surgical history and problem list.   Objective:   Vitals:   02/15/23 0900  BP: 109/60  Pulse: 82  Weight: 169 lb (76.7 kg)    Fetal Status: Fetal Heart Rate (bpm): 147 Fundal Height: 31 cm Movement: Present     General:  Alert, oriented and cooperative. Patient is in no acute distress.  Skin: Skin is warm and dry. No rash noted.   Cardiovascular: Normal heart rate noted  Respiratory: Normal respiratory effort, no problems with respiration noted  Abdomen: Soft, gravid, appropriate for gestational age.  Pain/Pressure: Absent     Pelvic: Cervical exam deferred        Extremities: Normal range of motion.  Edema: None  Mental Status: Normal mood and affect. Normal behavior. Normal judgment and thought content.   Assessment and Plan:  Pregnancy: [id number] at [redacted]w[redacted]d 1. Encounter for supervision of low-risk pregnancy in third trimester (Primary) -Patient stale and feeling well. FHR and FH appropriate. Maternal vitals wnl.  -Scant blood on wiping, likely secondary to untreated trichomoniasis infection. See below.   2. [redacted] weeks gestation of pregnancy - Tdap at next visit  - 2 hr GTT today  - Anticipatory guidance about next  visits/weeks of pregnancy given.  3. Trichomoniasis - Began metronidazole therapy two days ago - TOC in 3 weeks - Advised if increase in bleeding, she should present for care.   Preterm labor symptoms and general obstetric precautions including but not limited to vaginal bleeding, contractions, leaking of fluid and fetal movement were reviewed in detail with the patient.  Please refer to After Visit Summary for other counseling recommendations.   No follow-ups on file.  Future Appointments  Date Time Provider Department Center  03/02/2023 10:55 AM Bernerd Limbo, CNM [facility]    Ralene Muskrat, [place]

## 2023-02-16 LAB — HORIZON CUSTOM: REPORT SUMMARY: NEGATIVE

## 2023-02-16 LAB — GLUCOSE TOLERANCE, 2 HOURS W/ 1HR
Glucose, 1 hour: 130 mg/dL (ref 70–179)
Glucose, 2 hour: 89 mg/dL (ref 70–152)
Glucose, Fasting: 75 mg/dL (ref 70–91)

## 2023-02-18 ENCOUNTER — Other Ambulatory Visit: Payer: Self-pay

## 2023-02-18 ENCOUNTER — Inpatient Hospital Stay (HOSPITAL_COMMUNITY): Payer: Medicaid Other

## 2023-02-18 ENCOUNTER — Encounter (HOSPITAL_COMMUNITY): Payer: Self-pay | Admitting: Obstetrics & Gynecology

## 2023-02-18 ENCOUNTER — Inpatient Hospital Stay (HOSPITAL_COMMUNITY)
Admission: AD | Admit: 2023-02-18 | Discharge: 2023-02-25 | DRG: 833 | Disposition: A | Discharge status: Home / Self Care | Payer: Medicaid Other | Attending: Obstetrics and Gynecology | Admitting: Obstetrics and Gynecology

## 2023-02-18 DIAGNOSIS — Z5982 Transportation insecurity: Secondary | ICD-10-CM

## 2023-02-18 DIAGNOSIS — R1030 Lower abdominal pain, unspecified: Secondary | ICD-10-CM | POA: Diagnosis present

## 2023-02-18 DIAGNOSIS — R55 Syncope and collapse: Secondary | ICD-10-CM | POA: Diagnosis present

## 2023-02-18 DIAGNOSIS — O288 Other abnormal findings on antenatal screening of mother: Secondary | ICD-10-CM

## 2023-02-18 DIAGNOSIS — O4413 Placenta previa with hemorrhage, third trimester: Secondary | ICD-10-CM | POA: Diagnosis not present

## 2023-02-18 DIAGNOSIS — O4693 Antepartum hemorrhage, unspecified, third trimester: Principal | ICD-10-CM | POA: Diagnosis present

## 2023-02-18 DIAGNOSIS — O9A213 Injury, poisoning and certain other consequences of external causes complicating pregnancy, third trimester: Secondary | ICD-10-CM | POA: Diagnosis not present

## 2023-02-18 DIAGNOSIS — W19XXXA Unspecified fall, initial encounter: Secondary | ICD-10-CM | POA: Diagnosis present

## 2023-02-18 DIAGNOSIS — Z3493 Encounter for supervision of normal pregnancy, unspecified, third trimester: Secondary | ICD-10-CM

## 2023-02-18 DIAGNOSIS — Z8249 Family history of ischemic heart disease and other diseases of the circulatory system: Secondary | ICD-10-CM

## 2023-02-18 DIAGNOSIS — Z3A34 34 weeks gestation of pregnancy: Secondary | ICD-10-CM

## 2023-02-18 DIAGNOSIS — Z3A35 35 weeks gestation of pregnancy: Secondary | ICD-10-CM | POA: Diagnosis not present

## 2023-02-18 DIAGNOSIS — R519 Headache, unspecified: Secondary | ICD-10-CM | POA: Diagnosis present

## 2023-02-18 DIAGNOSIS — O0933 Supervision of pregnancy with insufficient antenatal care, third trimester: Secondary | ICD-10-CM | POA: Diagnosis not present

## 2023-02-18 DIAGNOSIS — W1811XA Fall from or off toilet without subsequent striking against object, initial encounter: Secondary | ICD-10-CM

## 2023-02-18 DIAGNOSIS — O4403 Placenta previa specified as without hemorrhage, third trimester: Secondary | ICD-10-CM | POA: Diagnosis not present

## 2023-02-18 DIAGNOSIS — Z8759 Personal history of other complications of pregnancy, childbirth and the puerperium: Secondary | ICD-10-CM | POA: Diagnosis present

## 2023-02-18 LAB — DIC (DISSEMINATED INTRAVASCULAR COAGULATION)PANEL
D-Dimer, Quant: 1 ug{FEU}/mL — ABNORMAL HIGH (ref 0.00–0.50)
Fibrinogen: 406 mg/dL (ref 210–475)
INR: 1 (ref 0.8–1.2)
Platelets: 219 10*3/uL (ref 150–400)
Prothrombin Time: 13.8 s (ref 11.4–15.2)
Smear Review: NONE SEEN
aPTT: 24 s (ref 24–36)

## 2023-02-18 LAB — COMPREHENSIVE METABOLIC PANEL
ALT: 14 U/L (ref 0–44)
AST: 19 U/L (ref 15–41)
Albumin: 2.9 g/dL — ABNORMAL LOW (ref 3.5–5.0)
Alkaline Phosphatase: 109 U/L (ref 38–126)
Anion gap: 11 (ref 5–15)
BUN: 13 mg/dL (ref 6–20)
CO2: 20 mmol/L — ABNORMAL LOW (ref 22–32)
Calcium: 8.6 mg/dL — ABNORMAL LOW (ref 8.9–10.3)
Chloride: 105 mmol/L (ref 98–111)
Creatinine, Ser: 0.8 mg/dL (ref 0.44–1.00)
GFR, Estimated: >60 mL/min (ref >60)
Glucose, Bld: 95 mg/dL (ref 70–99)
Potassium: 3.7 mmol/L (ref 3.5–5.1)
Sodium: 136 mmol/L (ref 135–145)
Total Bilirubin: 0.4 mg/dL (ref <1.2)
Total Protein: 6 g/dL — ABNORMAL LOW (ref 6.5–8.1)

## 2023-02-18 LAB — TYPE AND SCREEN
ABO/RH(D): O POS
Antibody Screen: NEGATIVE

## 2023-02-18 LAB — CBC
HCT: 31.4 % — ABNORMAL LOW (ref 36.0–46.0)
Hemoglobin: 10.3 g/dL — ABNORMAL LOW (ref 12.0–15.0)
MCH: 30.5 pg (ref 26.0–34.0)
MCHC: 32.8 g/dL (ref 30.0–36.0)
MCV: 92.9 fL (ref 80.0–100.0)
Platelets: 218 10*3/uL (ref 150–400)
RBC: 3.38 MIL/uL — ABNORMAL LOW (ref 3.87–5.11)
RDW: 13.5 % (ref 11.5–15.5)
WBC: 14 10*3/uL — ABNORMAL HIGH (ref 4.0–10.5)
nRBC: 0 % (ref 0.0–0.2)

## 2023-02-18 LAB — GLUCOSE, CAPILLARY: Glucose-Capillary: 117 mg/dL — ABNORMAL HIGH (ref 70–99)

## 2023-02-18 LAB — WET PREP, GENITAL
Clue Cells Wet Prep HPF POC: NONE SEEN
Sperm: NONE SEEN
Trich, Wet Prep: NONE SEEN
WBC, Wet Prep HPF POC: <10 (ref <10)
Yeast Wet Prep HPF POC: NONE SEEN

## 2023-02-18 MED ORDER — DOCUSATE SODIUM 100 MG PO CAPS
100.0000 mg | ORAL_CAPSULE | Freq: Every day | ORAL | Status: DC
  Administered 2023-02-19 – 2023-02-25 (×7): 100 mg via ORAL
  Filled 2023-02-18 (×8): qty 1

## 2023-02-18 MED ORDER — LACTATED RINGERS IV BOLUS
1000.0000 mL | Freq: Once | INTRAVENOUS | Status: AC
  Administered 2023-02-18: 1000 mL via INTRAVENOUS

## 2023-02-18 MED ORDER — ACETAMINOPHEN 325 MG PO TABS
650.0000 mg | ORAL_TABLET | ORAL | Status: DC | PRN
  Administered 2023-02-18 – 2023-02-19 (×2): 650 mg via ORAL
  Filled 2023-02-18 (×2): qty 2

## 2023-02-18 MED ORDER — SODIUM CHLORIDE 0.9% FLUSH
3.0000 mL | Freq: Two times a day (BID) | INTRAVENOUS | Status: DC
  Administered 2023-02-19 – 2023-02-23 (×10): 10 mL via INTRAVENOUS
  Administered 2023-02-24: 3 mL via INTRAVENOUS
  Administered 2023-02-24: 10 mL via INTRAVENOUS

## 2023-02-18 MED ORDER — PRENATAL MULTIVITAMIN CH
1.0000 | ORAL_TABLET | Freq: Every day | ORAL | Status: DC
  Administered 2023-02-19 – 2023-02-25 (×7): 1 via ORAL
  Filled 2023-02-18 (×7): qty 1

## 2023-02-18 MED ORDER — CALCIUM CARBONATE ANTACID 500 MG PO CHEW: 2.0000 | CHEWABLE_TABLET | ORAL | Status: DC | PRN

## 2023-02-18 NOTE — MAU Provider Note (Cosign Needed Addendum)
MAU Provider Note  Chief Complaint: Fall and Vaginal Bleeding  Provider seen 1942   SUBJECTIVE HPI: Erica Horton is a 27 y.o. [id number] at [redacted]w[redacted]d by early ultrasound who presents to maternity admissions reporting vaginal bleeding and fall. Pregnancy c/b late start to care, trichomonas infection. Receives [facility] with [facility].  Patient notes spotting throughout pregnancy, especially after being diagnosed with trich/yeast infection. Just started treatment for these. However had a large gush of bright blood while going to the bathroom. She does not remember what happened afterwards. Her SO noted her slumping over the toilet, then trying to get back up and falling in between the toilet seat and the wall. Notes she did not hit her head. She notes having a headache now (frontal) and lower abdominal cramping. Denies leaking clear fluid, urinary symptoms, pain anywhere else. +FM.   HPI  Past Medical History:  Diagnosis Date   Chlamydia infection affecting pregnancy in third trimester    Gastritis    Gestational diabetes    Past Surgical History:  Procedure Laterality Date   HERNIA REPAIR     Social History   Socioeconomic History   Marital status: Single    Spouse name: Not on file   Number of children: Not on file   Years of education: Not on file   Highest education level: Not on file  Occupational History   Not on file  Tobacco Use   Smoking status: Never   Smokeless tobacco: Never  Vaping Use   Vaping status: Never Used  Substance and Sexual Activity   Alcohol use: No   Drug use: No   Sexual activity: Not Currently  Other Topics Concern   Not on file  Social History Narrative   Not on file   Social Drivers of Health   Financial Resource Strain: Not on file  Food Insecurity: Food Insecurity Present (02/01/2023)   Hunger Vital Sign    Worried About Running Out of Food in the Last Year: Often true    Ran Out of Food in the Last Year: Often true  Transportation Needs: Unmet  Transportation Needs (02/01/2023)   PRAPARE - Administrator, Civil Service (Medical): Yes    Lack of Transportation (Non-Medical): Yes  Physical Activity: Not on file  Stress: Not on file  Social Connections: Not on file  Intimate Partner Violence: Not on file   No current facility-administered medications on file prior to encounter.   Current Outpatient Medications on File Prior to Encounter  Medication Sig Dispense Refill   Prenatal Vit-Fe Fumarate-FA (PREPLUS) 27-1 MG TABS Take 1 tablet by mouth daily. 30 tablet 13   terconazole (TERAZOL 7) 0.4 % vaginal cream Place 1 applicator vaginally at bedtime. 45 g 0   Allergies  Allergen Reactions   Coconut (Cocos Nucifera) Rash    ROS:  Pertinent positives/negatives listed above.  I have reviewed patient's Past Medical Hx, Surgical Hx, Family Hx, Social Hx, medications and allergies.   Physical Exam  Patient Vitals for the past 24 hrs:  BP Pulse Resp SpO2 Height Weight  02/18/23 1913 121/67 99 19 97 % 5\' 11"  (1.803 m) 77.1 kg   Constitutional: Well-developed, well-nourished female. Appears uncomfortable Cardiovascular: normal rate Respiratory: normal effort GI: Abd soft, non-tender, gravid MS: Extremities nontender, no edema, normal ROM Neurologic: Alert and oriented x 4. No gross abnormalities  PELVIC EXAM: Cervix pink, visually closed, without lesion, moderate sized clot removed with pooling of dark blood. No active bleeding noted  FHT:  Baseline 150, barely moderate variability, no accels, no decelerations Contractions: UI  LAB RESULTS Results for orders placed or performed during the hospital encounter of 02/18/23 (from the past 24 hours)  Glucose, capillary     Status: Abnormal   Collection Time: 02/18/23  7:20 PM  Result Value Ref Range   Glucose-Capillary 117 (H) 70 - 99 mg/dL  Wet prep, genital     Status: None   Collection Time: 02/18/23  8:11 PM  Result Value Ref Range   Yeast Wet Prep HPF POC NONE  SEEN NONE SEEN   Trich, Wet Prep NONE SEEN NONE SEEN   Clue Cells Wet Prep HPF POC NONE SEEN NONE SEEN   WBC, Wet Prep HPF POC <10 <10   Sperm NONE SEEN   CBC     Status: Abnormal   Collection Time: 02/18/23  8:35 PM  Result Value Ref Range   WBC 14.0 (H) 4.0 - 10.5 K/uL   RBC 3.38 (L) 3.87 - 5.11 MIL/uL   Hemoglobin 10.3 (L) 12.0 - 15.0 g/dL   HCT 60.4 (L) 54.0 - 98.1 %   MCV 92.9 80.0 - 100.0 fL   MCH 30.5 26.0 - 34.0 pg   MCHC 32.8 30.0 - 36.0 g/dL   RDW 19.1 47.8 - 29.5 %   Platelets 218 150 - 400 K/uL   nRBC 0.0 0.0 - 0.2 %  Type and screen [facility]     Status: None (Preliminary result)   Collection Time: 02/18/23  8:35 PM  Result Value Ref Range   ABO/RH(D) PENDING    Antibody Screen PENDING    Sample Expiration      02/21/2023,2359 Performed at [facility], [address], [place], [place] [address]   DIC Panel ONCE - STAT     Status: None (Preliminary result)   Collection Time: 02/18/23  8:36 PM  Result Value Ref Range   Prothrombin Time PENDING 11.4 - 15.2 seconds   INR PENDING 0.8 - 1.2   aPTT PENDING 24 - 36 seconds   Fibrinogen PENDING 210 - 475 mg/dL   D-Dimer, Quant PENDING 0.00 - 0.50 ug/mL-FEU   Platelets 219 150 - 400 K/uL   Smear Review PENDING     --/--/PENDING (12/21 2035)  IMAGING No results found.  MAU Management/MDM: Orders Placed This Encounter  Procedures   Wet prep, genital   [place] MFM OB LIMITED   US MFM FETAL BPP WO NON STRESS   Glucose, capillary   CBC   Comprehensive metabolic panel   DIC Panel ONCE - STAT   Type and screen [facility]    Meds ordered this encounter  Medications   lactated ringers bolus 1,000 mL     Available prenatal records reviewed.  Patient assessed quickly upon arrival given acuity of complaint. Multiple concerns:  #VB: Concern for abruption given fall, bleeding, cramping, and flat tracing. CBC, DIC panel, U/S ordered. Continuous monitoring.  #Fall: Do  suspect possible concussion. Did not order CT head as patient has no focal deficits and is not on blood thinners. No evidence of other injury/fracture.  #Syncope: in setting of bleeding, anemia.  #FWB: BPP and continuous monitoring. Non-reactive tracing.  ASSESSMENT 1. Vaginal bleeding in pregnancy, third trimester   2. Fall, initial encounter   3. Syncope and collapse   4. Late prenatal care affecting pregnancy in third trimester   5. Encounter for supervision of low-risk pregnancy in third trimester   6. [redacted] weeks  gestation of pregnancy     PLAN Patient signed out to oncoming provider, Gerrit Heck, CNM for further care.  Wylene Simmer, MD OB Fellow 02/18/2023  9:04 PM

## 2023-02-18 NOTE — MAU Note (Signed)
.  Erica Horton is a 27 y.o. at [redacted]w[redacted]d here in MAU reporting: Pt reports bleeding at around 6pm and has used two medium size pads. No bloods clots. Around 610pm pt states she started feeling dizzy and fell on her back in  between the tub and the toilet. Pt report her right arm seems sore.  Pt did not hurt her head and does not report any back pain.    Pain score: 5/10 headache, 3/10 lower left abdominal cramping.  Pt states she has not taken any medications.    Vitals:   02/18/23 1913  BP: 121/67  Pulse: 99  Resp: 19  SpO2: 97%     FHT:164  Lab orders placed from triage:  POCT CBG

## 2023-02-18 NOTE — H&P (Incomplete)
ANTEPARTUM ADMISSION HISTORY AND PHYSICAL NOTE   History of Present Illness: HPI: Erica Horton is a 27 y.o. [id number] at [redacted]w[redacted]d by early ultrasound who presents to maternity admissions reporting vaginal bleeding and fall. Pregnancy c/b late start to care, trichomonas infection. Receives [facility] with [facility].   Patient notes spotting throughout pregnancy, especially after being diagnosed with trich/yeast infection. Just started treatment for these. However had a large gush of bright blood while going to the bathroom. She does not remember what happened afterwards. Her SO noted her slumping over the toilet, then trying to get back up and falling in between the toilet seat and the wall. Notes she did not hit her head. She notes having a headache now (frontal) and lower abdominal cramping. Denies leaking clear fluid, urinary symptoms, pain anywhere else. +FM.   Patient Active Problem List   Diagnosis Date Noted   Supervision of low-risk pregnancy 01/19/2023   Late prenatal care affecting pregnancy in third trimester 01/19/2023    Past Medical History:  Diagnosis Date   Chlamydia infection affecting pregnancy in third trimester    Gastritis    Gestational diabetes     Past Surgical History:  Procedure Laterality Date   HERNIA REPAIR      OB History  Gravida Para Term Preterm AB Living  5 3 3  0 1 3  SAB IAB Ectopic Multiple Live Births  1   0 3    # Outcome Date GA Lbr Len/2nd Weight Sex Type Anes PTL Lv  5 Current           4 Term 04/15/21 [redacted]w[redacted]d 06:11 / 00:02 3280 g M Vag-Spont None  LIV  3 SAB 06/02/20 [redacted]w[redacted]d         2 Term 10/01/19 [redacted]w[redacted]d 10:48 / 00:07 3090 g F Vag-Spont EPI  LIV  1 Term 03/03/16 [redacted]w[redacted]d 14:02 / 00:32 3405 g M Vag-Spont EPI  LIV    Social History   Socioeconomic History   Marital status: Single    Spouse name: Not on file   Number of children: Not on file   Years of education: Not on file   Highest education level: Not on file  Occupational History   Not on file   Tobacco Use   Smoking status: Never   Smokeless tobacco: Never  Vaping Use   Vaping status: Never Used  Substance and Sexual Activity   Alcohol use: No   Drug use: No   Sexual activity: Not Currently  Other Topics Concern   Not on file  Social History Narrative   Not on file   Social Drivers of Health   Financial Resource Strain: Not on file  Food Insecurity: Food Insecurity Present (02/01/2023)   Hunger Vital Sign    Worried About Running Out of Food in the Last Year: Often true    Ran Out of Food in the Last Year: Often true  Transportation Needs: Unmet Transportation Needs (02/01/2023)   PRAPARE - Administrator, Civil Service (Medical): Yes    Lack of Transportation (Non-Medical): Yes  Physical Activity: Not on file  Stress: Not on file  Social Connections: Not on file    Family History  Problem Relation Age of Onset   Hypertension Father     Allergies  Allergen Reactions   Coconut (Cocos Nucifera) Rash    Medications Prior to Admission  Medication Sig Dispense Refill Last Dose/Taking   Prenatal Vit-Fe Fumarate-FA (PREPLUS) 27-1 MG TABS Take 1 tablet by mouth  daily. 30 tablet 13 02/18/2023   terconazole (TERAZOL 7) 0.4 % vaginal cream Place 1 applicator vaginally at bedtime. 45 g 0 02/18/2023    Review of Systems - {ros master:310782}  Vitals:  BP 121/67 (BP Location: Right Arm)   Pulse 99   Resp 19   Ht 5\' 11"  (1.803 m)   Wt 77.1 kg   SpO2 97%   BMI 23.71 kg/m  Physical Examination: CONSTITUTIONAL: Well-developed, well-nourished female in no acute distress.  HENT:  Normocephalic, atraumatic, External right and left ear normal. Oropharynx is clear and moist EYES: Conjunctivae and EOM are normal. Pupils are equal, round, and reactive to light. No scleral icterus.  NECK: Normal range of motion, supple, no masses SKIN: Skin is warm and dry. No rash noted. Not diaphoretic. No erythema. No pallor. NEUROLGIC: Alert and oriented to person, place,  and time. Normal reflexes, muscle tone coordination. No cranial nerve deficit noted. PSYCHIATRIC: Normal mood and affect. Normal behavior. Normal judgment and thought content. CARDIOVASCULAR: Normal heart rate noted, regular rhythm RESPIRATORY: Effort and breath sounds normal, no problems with respiration noted ABDOMEN: Soft, nontender, nondistended, gravid. MUSCULOSKELETAL: Normal range of motion. No edema and no tenderness. 2+ distal pulses.  Cervix: {Pe_cervix_labor:31295} and found to be {:31290}/ {Ob effacement:14523}/{station:14562} and fetal presentation is {desc; fetal presentation:14558}. Membranes:{:314523} Fetal Monitoring:{findings; monitor fetal heart monitor:31527} Tocometer: ***Flat  Labs:  Results for orders placed or performed during the hospital encounter of 02/18/23 (from the past 24 hours)  Glucose, capillary   Collection Time: 02/18/23  7:20 PM  Result Value Ref Range   Glucose-Capillary 117 (H) 70 - 99 mg/dL  Wet prep, genital   Collection Time: 02/18/23  8:11 PM  Result Value Ref Range   Yeast Wet Prep HPF POC NONE SEEN NONE SEEN   Trich, Wet Prep NONE SEEN NONE SEEN   Clue Cells Wet Prep HPF POC NONE SEEN NONE SEEN   WBC, Wet Prep HPF POC <10 <10   Sperm NONE SEEN   CBC   Collection Time: 02/18/23  8:35 PM  Result Value Ref Range   WBC 14.0 (H) 4.0 - 10.5 K/uL   RBC 3.38 (L) 3.87 - 5.11 MIL/uL   Hemoglobin 10.3 (L) 12.0 - 15.0 g/dL   HCT 16.1 (L) 09.6 - 04.5 %   MCV 92.9 80.0 - 100.0 fL   MCH 30.5 26.0 - 34.0 pg   MCHC 32.8 30.0 - 36.0 g/dL   RDW 40.9 81.1 - 91.4 %   Platelets 218 150 - 400 K/uL   nRBC 0.0 0.0 - 0.2 %  Type and screen [facility]   Collection Time: 02/18/23  8:35 PM  Result Value Ref Range   ABO/RH(D) PENDING    Antibody Screen PENDING    Sample Expiration      02/21/2023,2359 Performed at [facility], [address], [place], [place] [address]   DIC Panel ONCE - STAT   Collection Time: 02/18/23  8:36 PM   Result Value Ref Range   Prothrombin Time PENDING 11.4 - 15.2 seconds   INR PENDING 0.8 - 1.2   aPTT PENDING 24 - 36 seconds   Fibrinogen PENDING 210 - 475 mg/dL   D-Dimer, Quant PENDING 0.00 - 0.50 ug/mL-FEU   Platelets 219 150 - 400 K/uL   Smear Review PENDING     Imaging Studies: No results found.   Assessment and Plan: Patient Active Problem List   Diagnosis Date Noted   Supervision of low-risk pregnancy  01/19/2023   Late prenatal care affecting pregnancy in third trimester 01/19/2023   Admit to Antenatal Routine antenatal care  Joanne Gavel, MD OB Fellow Faculty Practice, [facility]

## 2023-02-18 NOTE — H&P (Signed)
ANTEPARTUM ADMISSION HISTORY AND PHYSICAL NOTE     History of Present Illness: HPI: Erica Horton is a 27 y.o. [id number] at [redacted]w[redacted]d by early ultrasound who presents to maternity admissions reporting vaginal bleeding and fall. Pregnancy c/b late start to care, trichomonas infection. Receives [facility] with [facility].   Patient notes spotting throughout pregnancy, especially after being diagnosed with trich/yeast infection. Just started treatment for these. However had a large gush of bright blood while going to the bathroom. She does not remember what happened afterwards. Her SO noted her slumping over the toilet, then trying to get back up and falling in between the toilet seat and the wall. Notes she did not hit her head. She notes having a headache now (frontal) and lower abdominal cramping. Denies leaking clear fluid, urinary symptoms, pain anywhere else. +FM.        Patient Active Problem List    Diagnosis Date Noted   Supervision of low-risk pregnancy 01/19/2023   Late prenatal care affecting pregnancy in third trimester 01/19/2023          Past Medical History:  Diagnosis Date   Chlamydia infection affecting pregnancy in third trimester     Gastritis     Gestational diabetes                 Past Surgical History:  Procedure Laterality Date   HERNIA REPAIR                               OB History  Gravida Para Term Preterm AB Living   5 3 3  0 1 3   SAB IAB Ectopic Multiple Live Births      1     0 3         # Outcome Date GA Lbr Len/2nd Weight Sex Type Anes PTL Lv  5 Current                    4 Term 04/15/21 [redacted]w[redacted]d 06:11 / 00:02 3280 g M Vag-Spont None   LIV  3 SAB 06/02/20 [redacted]w[redacted]d                2 Term 10/01/19 [redacted]w[redacted]d 10:48 / 00:07 3090 g F Vag-Spont EPI   LIV  1 Term 03/03/16 [redacted]w[redacted]d 14:02 / 00:32 3405 g M Vag-Spont EPI   LIV      Social History         Socioeconomic History   Marital status: Single      Spouse name: Not on file   Number of children: Not on file   Years of  education: Not on file   Highest education level: Not on file  Occupational History   Not on file  Tobacco Use   Smoking status: Never   Smokeless tobacco: Never  Vaping Use   Vaping status: Never Used  Substance and Sexual Activity   Alcohol use: No   Drug use: No   Sexual activity: Not Currently  Other Topics Concern   Not on file  Social History Narrative   Not on file    Social Drivers of Health        Financial Resource Strain: Not on file  Food Insecurity: Food Insecurity Present (02/01/2023)    Hunger Vital Sign     Worried About Running Out of Food in the Last Year: Often true     Ran Out of Food in the Last Year:  Often true  Transportation Needs: Unmet Transportation Needs (02/01/2023)    PRAPARE - Therapist, art (Medical): Yes     Lack of Transportation (Non-Medical): Yes  Physical Activity: Not on file  Stress: Not on file  Social Connections: Not on file           Family History  Problem Relation Age of Onset   Hypertension Father            Allergies      Allergies  Allergen Reactions   Coconut (Cocos Nucifera) Rash               Medications Prior to Admission  Medication Sig Dispense Refill Last Dose/Taking   Prenatal Vit-Fe Fumarate-FA (PREPLUS) 27-1 MG TABS Take 1 tablet by mouth daily. 30 tablet 13 02/18/2023   terconazole (TERAZOL 7) 0.4 % vaginal cream Place 1 applicator vaginally at bedtime. 45 g 0 02/18/2023          Review of Systems - History obtained from chart review and the patient   Vitals:  BP 121/67 (BP Location: Right Arm)   Pulse 99   Resp 19   Ht 5\' 11"  (1.803 m)   Wt 77.1 kg   SpO2 97%   BMI 23.71 kg/m  Physical Examination: CONSTITUTIONAL: Well-developed, well-nourished female in no acute distress.  HENT:  Normocephalic, atraumatic, External right and left ear normal. Oropharynx is clear and moist EYES: Conjunctivae and EOM are normal. Pupils are equal, round, and reactive to light. No  scleral icterus.  NECK: Normal range of motion, supple, no masses SKIN: Skin is warm and dry. No rash noted. Not diaphoretic. No erythema. No pallor. NEUROLGIC: Alert and oriented to person, place, and time. Normal reflexes, muscle tone coordination. No cranial nerve deficit noted. PSYCHIATRIC: Normal mood and affect. Normal behavior. Normal judgment and thought content. CARDIOVASCULAR: Normal heart rate noted, regular rhythm RESPIRATORY: Effort and breath sounds normal, no problems with respiration noted ABDOMEN: Soft, nontender, nondistended, gravid. MUSCULOSKELETAL: Normal range of motion. No edema and no tenderness. 2+ distal pulses.   Cervix: Evaluated by sterile speculum exam. and found to be not evaluated/ Long/Floating and fetal presentation is cephalic. Membranes:intact Fetal Monitoring:Baseline: 135 bpm, Variability: Good {> 6 bpm), Accelerations: Reactive, and Decelerations: Absent Tocometer: Q77min   Labs:        Results for orders placed or performed during the hospital encounter of 02/18/23 (from the past 24 hours)  Glucose, capillary    Collection Time: 02/18/23  7:20 PM  Result Value Ref Range    Glucose-Capillary 117 (H) 70 - 99 mg/dL  Wet prep, genital    Collection Time: 02/18/23  8:11 PM  Result Value Ref Range    Yeast Wet Prep HPF POC NONE SEEN NONE SEEN    Trich, Wet Prep NONE SEEN NONE SEEN    Clue Cells Wet Prep HPF POC NONE SEEN NONE SEEN    WBC, Wet Prep HPF POC <10 <10    Sperm NONE SEEN    CBC    Collection Time: 02/18/23  8:35 PM  Result Value Ref Range    WBC 14.0 (H) 4.0 - 10.5 K/uL    RBC 3.38 (L) 3.87 - 5.11 MIL/uL    Hemoglobin 10.3 (L) 12.0 - 15.0 g/dL    HCT 88.4 (L) 16.6 - 46.0 %    MCV 92.9 80.0 - 100.0 fL    MCH 30.5 26.0 - 34.0 pg    MCHC 32.8 30.0 -  36.0 g/dL    RDW 84.1 32.4 - 40.1 %    Platelets 218 150 - 400 K/uL    nRBC 0.0 0.0 - 0.2 %  Type and screen [facility]    Collection Time: 02/18/23  8:35 PM  Result  Value Ref Range    ABO/RH(D) PENDING      Antibody Screen PENDING      Sample Expiration          02/21/2023,2359 Performed at [facility], [address] [place], [place] [address]    DIC Panel ONCE - STAT    Collection Time: 02/18/23  8:36 PM  Result Value Ref Range    Prothrombin Time PENDING 11.4 - 15.2 seconds    INR PENDING 0.8 - 1.2    aPTT PENDING 24 - 36 seconds    Fibrinogen PENDING 210 - 475 mg/dL    D-Dimer, Quant PENDING 0.00 - 0.50 ug/mL-FEU    Platelets 219 150 - 400 K/uL    Smear Review PENDING        Imaging Studies: Imaging Results  No results found.       Assessment and Plan:     Patient Active Problem List    Diagnosis Date Noted   Supervision of low-risk pregnancy 01/19/2023   Late prenatal care affecting pregnancy in third trimester 01/19/2023    Admit to Antenatal Routine antenatal care   Joanne Gavel, MD OB Fellow Faculty Practice, [facility]         Cherre Robins MSN, CNM Advanced Practice Provider, [facility]

## 2023-02-19 DIAGNOSIS — O4403 Placenta previa specified as without hemorrhage, third trimester: Secondary | ICD-10-CM | POA: Diagnosis present

## 2023-02-19 DIAGNOSIS — Z8759 Personal history of other complications of pregnancy, childbirth and the puerperium: Secondary | ICD-10-CM | POA: Diagnosis present

## 2023-02-19 NOTE — Progress Notes (Signed)
FACULTY PRACTICE ANTEPARTUM PROGRESS NOTE  Erica Horton is a 27 y.o. [id number] at [redacted]w[redacted]d who is admitted for vaginal bleeding in the third trimester.  Estimated Date of Delivery: 03/30/23 Fetal presentation is cephalic.  Length of Stay:  1 Days. Admitted 02/18/2023  Subjective: Pt doing well this morning.  Speculum exam performed last night due to report of recurrent vaginal bleeding.  Small amount of old clot in the cervical os with old blood in the vaginal vault. Patient reports normal fetal movement.  She denies uterine contractions, denies bleeding and leaking of fluid per vagina.  Vitals:  Blood pressure (!) 101/47, pulse 80, temperature 97.6 F (36.4 C), temperature source Oral, resp. rate 18, height 5\' 11"  (1.803 m), weight 77.1 kg, SpO2 100%, unknown if currently breastfeeding. Physical Examination: CONSTITUTIONAL: Well-developed, well-nourished female in no acute distress.  HENT:  Normocephalic, atraumatic, External right and left ear normal. Oropharynx is clear and moist EYES: Conjunctivae and EOM are normal. Pupils are equal, round, and reactive to light. No scleral icterus.  NECK: Normal range of motion, supple, no masses. SKIN: Skin is warm and dry. No rash noted. Not diaphoretic. No erythema. No pallor. NEUROLGIC: Alert and oriented to person, place, and time. Normal reflexes, muscle tone coordination. No cranial nerve deficit noted. PSYCHIATRIC: Normal mood and affect. Normal behavior. Normal judgment and thought content. CARDIOVASCULAR: Normal heart rate noted, regular rhythm RESPIRATORY: Effort and breath sounds normal, no problems with respiration noted MUSCULOSKELETAL: Normal range of motion. No edema and no tenderness. ABDOMEN: Soft, nontender, nondistended, gravid. CERVIX: deferred  Fetal monitoring: FHR: 130s bpm, Variability: moderate, Accelerations: Present, Decelerations: Absent  Uterine activity: irritability  Results for orders placed or performed during the  hospital encounter of 02/18/23 (from the past 48 hours)  Glucose, capillary     Status: Abnormal   Collection Time: 02/18/23  7:20 PM  Result Value Ref Range   Glucose-Capillary 117 (H) 70 - 99 mg/dL    Comment: Glucose reference range applies only to samples taken after fasting for at least 8 hours.  Wet prep, genital     Status: None   Collection Time: 02/18/23  8:11 PM  Result Value Ref Range   Yeast Wet Prep HPF POC NONE SEEN NONE SEEN   Trich, Wet Prep NONE SEEN NONE SEEN   Clue Cells Wet Prep HPF POC NONE SEEN NONE SEEN   WBC, Wet Prep HPF POC <10 <10   Sperm NONE SEEN     Comment: Performed at [facility], [address], [place], [place] [address]  CBC     Status: Abnormal   Collection Time: 02/18/23  8:35 PM  Result Value Ref Range   WBC 14.0 (H) 4.0 - 10.5 K/uL   RBC 3.38 (L) 3.87 - 5.11 MIL/uL   Hemoglobin 10.3 (L) 12.0 - 15.0 g/dL   HCT 30.8 (L) 65.7 - 84.6 %   MCV 92.9 80.0 - 100.0 fL   MCH 30.5 26.0 - 34.0 pg   MCHC 32.8 30.0 - 36.0 g/dL   RDW 96.2 95.2 - 84.1 %   Platelets 218 150 - 400 K/uL   nRBC 0.0 0.0 - 0.2 %    Comment: Performed at [facility], [address] [place], [place] [address]  Comprehensive metabolic panel     Status: Abnormal   Collection Time: 02/18/23  8:35 PM  Result Value Ref Range   Sodium 136 135 - 145 mmol/L   Potassium 3.7 3.5 - 5.1 mmol/L  Chloride 105 98 - 111 mmol/L   CO2 20 (L) 22 - 32 mmol/L   Glucose, Bld 95 70 - 99 mg/dL    Comment: Glucose reference range applies only to samples taken after fasting for at least 8 hours.   BUN 13 6 - 20 mg/dL   Creatinine, Ser 1.61 0.44 - 1.00 mg/dL   Calcium 8.6 (L) 8.9 - 10.3 mg/dL   Total Protein 6.0 (L) 6.5 - 8.1 g/dL   Albumin 2.9 (L) 3.5 - 5.0 g/dL   AST 19 15 - 41 U/L   ALT 14 0 - 44 U/L   Alkaline Phosphatase 109 38 - 126 U/L   Total Bilirubin 0.4 <1.2 mg/dL   GFR, Estimated >09 >60 mL/min    Comment: (NOTE) Calculated using the CKD-EPI Creatinine Equation  (2021)    Anion gap 11 5 - 15    Comment: Performed at [facility], [address], [place], [place] [address]  Type and screen [facility]     Status: None   Collection Time: 02/18/23  8:35 PM  Result Value Ref Range   ABO/RH(D) O POS    Antibody Screen NEG    Sample Expiration      02/21/2023,2359 Performed at [facility], [address] [place], [place] [address]   DIC Panel ONCE - STAT     Status: Abnormal   Collection Time: 02/18/23  8:36 PM  Result Value Ref Range   Prothrombin Time 13.8 11.4 - 15.2 seconds   INR 1.0 0.8 - 1.2    Comment: (NOTE) INR goal varies based on device and disease states.    aPTT 24 24 - 36 seconds   Fibrinogen 406 210 - 475 mg/dL    Comment: (NOTE) Fibrinogen results may be underestimated in patients receiving thrombolytic therapy.    D-Dimer, Quant 1.00 (H) 0.00 - 0.50 ug/mL-FEU    Comment: (NOTE) At the manufacturer cut-off value of 0.5 g/mL FEU, this assay has a negative predictive value of 95-100%.This assay is intended for use in conjunction with a clinical pretest probability (PTP) assessment model to exclude pulmonary embolism (PE) and deep venous thrombosis (DVT) in outpatients suspected of PE or DVT. Results should be correlated with clinical presentation.    Platelets 219 150 - 400 K/uL   Smear Review NO SCHISTOCYTES SEEN     Comment: Performed at [facility], [address], [place], [place] [address]  OBSTETRICS REPORT                    (Corrected Final 02/18/2023 10:41 pm) ---------------------------------------------------------------------- Patient Info    ID #:       [id number]                          D.O.B.:  [birth year] (27 yrs)(F)  Name:       Erica Horton                 Visit Date: 02/18/2023 08:38 pm ---------------------------------------------------------------------- Performed By    Attending:        Noralee Space MD        Referred By:       [facility] MAU/Triage  Performed By:      Earley Brooke     [facility] ---------------------------------------------------------------------- Orders    #  Description                           Code        Ordered By  1  [place] MFM FETAL BPP WO NON               76819.01    ELIZABETH DAVIS     STRESS  2  [place] MFM OB LIMITED                     76815.01    ELIZABETH DAVIS  3  [place] MFM OB TRANSVAGINAL                21308.6     [facility] DAVIS ----------------------------------------------------------------------    #  Order #                     Accession #                Episode #  1  [id number]                   [id number]                 [id number]  2  [id number]                   [id number]                 [id number]  3  [id number]                   [id number]                 [id number] ---------------------------------------------------------------------- Indications    Placenta previa with hemorrhage, third          O44.13  trimester  Traumatic injury during pregnancy (Fall)        O9A.219 T14.90  [redacted] weeks gestation of pregnancy                 Z3A.34  Vaginal bleeding in pregnancy, third trimester  O46.93  Non-reactive NST                                O28.9 ---------------------------------------------------------------------- Fetal Evaluation    Num Of Fetuses:          1  Fetal Heart Rate(bpm):   154  Cardiac Activity:        Observed  Presentation:            Cephalic  Placenta:                Posterior previa  P. Cord Insertion:       Visualized    Amniotic Fluid  AFI FV:      Within normal limits    AFI Sum(cm)     %Tile       Largest Pocket(cm)  15.3            55          6    RUQ(cm)       RLQ(cm)  LUQ(cm)        LLQ(cm)  4.4           3.3           1.6            6    Comment:    No placental abruption identified by  Ultrasound. ---------------------------------------------------------------------- Biophysical Evaluation    Amniotic F.V:   Within normal limits       F. Tone:         Observed  F. Movement:    Observed                   Score:           6/8  F. Breathing:   Not Observed ---------------------------------------------------------------------- OB History    Gravidity:    5         Term:   3        Prem:   1 ---------------------------------------------------------------------- Gestational Age    Clinical EDD:  34w 2d                                        EDD:   03/30/23  Best:          34w 2d     Det. By:  Clinical EDD             EDD:   03/30/23 ---------------------------------------------------------------------- Impression    Patient was evaluated for c/o vaginal bleeding.  Amniotic fluid is normal and good fetal activity is seen.  Cephalic presentation. We performed transvaginal ultrasound  and placenta previa is seen. Fetal breathing movements did  not meet the criteria of BPP. Cephalic presentation. BPP 6/8.  Impression: Placenta previa.    I have reviewed the patient's current medications.  ASSESSMENT: Principal Problem:   Vaginal bleeding in pregnancy, third trimester   PLAN: Continue inpatient hospital stay No BMZ due to gestational age Pt advised likely delivery at 36-37 weeks Due to 6/8 BPP, will check BPP and growth scan on 12/23    Continue routine antenatal care.   Mariel Aloe, MD [facility] Faculty Attending, Center for [facility] 02/19/2023 8:27 AM

## 2023-02-19 NOTE — Progress Notes (Signed)
CSW was consulted due to medication assistance while admitted to [facility] at [redacted]w[redacted]d gestation due to vaginal bleeding and a fall. Upon chart review, patient was also identified at risk for transportation needs and food insecurity. CSW met with patient at bedside to assess for needs and provide support.   When CSW entered room, patient was observed laying in bed, talking on phone. Patient ended phone call. CSW introduced self and explained reason for visit. Patient presented as calm, welcomed, CSW and remained engaged during consult.   Patient explains that she will be scheduled for a c-section in 2 weeks if she does not go into labor before. CSW inquired about patient's other children. Patient shares her mother and grandmother are helping care for her children while she is inpatient due to FOB working. Patient shares she plans to stay with her mom in [place] once she gives birth. Patient reports overall financial strain due to having to stop working during her pregnancy as a result of medical complications.  CSW inquired about medication assistance needs. Patient explained that prior to admission, she was prescribed a medication and when she went to pick up medication from the pharmacy, her insurance was not listed on file. Patient explained that her insurance recently changed when she turned 27 from coverage under her mother to pregnancy Medicaid. CSW assisted patient in locating her Medicaid card virtually online through [employer] and requested that a physical card be mailed to her address.   CSW inquired about food insecurity. Patient reports that she has food stamps but does not receive enough benefits to cover food costs each month for herself and her 3 children. Patient reports she has [facility] but recently missed a re certification appointment for her son due to being inpatient (this hospitalization). Patient shares she has an upcoming appointment for herself, her daughter, and infant scheduled  after infant's due date (Infant's due date is 03/29/22). CSW encouraged patient to contact WIC to see if she can do a virtual re certification appointment for her son. CSW also provided patient with local food bank and [employer] contact information for additional food/household items.  CSW inquired about transportation needs. Patient reports that her car broke down recently and states she does not have transportation for herself or children. CSW provided Medicaid transportation information. Patient states she has transportation home from the hospital with a family member. CSW assessed for safety. Patient denies current SI/HI/DV.  CSW explained that if infant is born pre-term, a NICU social worker will meet with her to assess for needs/provide support. Patient expressed appreciation and declined additional resource needs at this time.   Signed,  Norberto Sorenson, MSW, LCSWA, LCASA 02/19/2023 10:47 AM

## 2023-02-20 ENCOUNTER — Inpatient Hospital Stay (HOSPITAL_COMMUNITY): Payer: Medicaid Other

## 2023-02-20 DIAGNOSIS — O0933 Supervision of pregnancy with insufficient antenatal care, third trimester: Secondary | ICD-10-CM

## 2023-02-20 DIAGNOSIS — O4413 Placenta previa with hemorrhage, third trimester: Secondary | ICD-10-CM

## 2023-02-20 DIAGNOSIS — Z3A34 34 weeks gestation of pregnancy: Secondary | ICD-10-CM

## 2023-02-20 NOTE — Progress Notes (Signed)
FACULTY PRACTICE ANTEPARTUM PROGRESS NOTE  Erica Horton is a 27 y.o. [id number] at [redacted]w[redacted]d who is admitted for third trimester vaginal bleeding.  Estimated Date of Delivery: 03/30/23 Fetal presentation is cephalic.  Length of Stay:  2 Days. Admitted 02/18/2023  Subjective: Pt slightly nausated this AM, but she attributes it to not eating hospital food.  Otherwise she is doing well.  Pt did note dark old blood and a small clot when she went to the restroom.  No active bleeding. Patient reports normal fetal movement.  She denies uterine contractions, and leaking of fluid per vagina.  Vitals:  Blood pressure 121/72, pulse 84, temperature 97.8 F (36.6 C), resp. rate 18, height 5\' 11"  (1.803 m), weight 77.1 kg, SpO2 100%, unknown if currently breastfeeding. Physical Examination: CONSTITUTIONAL: Well-developed, well-nourished female in no acute distress.  HENT:  Normocephalic, atraumatic, External right and left ear normal. Oropharynx is clear and moist EYES: Conjunctivae and EOM are normal.  NECK: Normal range of motion, supple, no masses. SKIN: Skin is warm and dry. No rash noted. Not diaphoretic. No erythema. No pallor. NEUROLGIC: Alert and oriented to person, place, and time. Normal reflexes, muscle tone coordination. No cranial nerve deficit noted. PSYCHIATRIC: Normal mood and affect. Normal behavior. Normal judgment and thought content. CARDIOVASCULAR: Normal heart rate noted, regular rhythm RESPIRATORY: Effort and breath sounds normal, no problems with respiration noted MUSCULOSKELETAL: Normal range of motion. No edema and no tenderness. ABDOMEN: Soft, nontender, nondistended, gravid. CERVIX: deferred  Fetal monitoring: FHR: 130s bpm, Variability: moderate, Accelerations: Present, Decelerations: Absent , category 1 strip Uterine activity: none  Results for orders placed or performed during the hospital encounter of 02/18/23 (from the past 48 hours)  Glucose, capillary     Status:  Abnormal   Collection Time: 02/18/23  7:20 PM  Result Value Ref Range   Glucose-Capillary 117 (H) 70 - 99 mg/dL    Comment: Glucose reference range applies only to samples taken after fasting for at least 8 hours.  Wet prep, genital     Status: None   Collection Time: 02/18/23  8:11 PM  Result Value Ref Range   Yeast Wet Prep HPF POC NONE SEEN NONE SEEN   Trich, Wet Prep NONE SEEN NONE SEEN   Clue Cells Wet Prep HPF POC NONE SEEN NONE SEEN   WBC, Wet Prep HPF POC <10 <10   Sperm NONE SEEN     Comment: Performed at [facility], [address] [place], [place] [address]  CBC     Status: Abnormal   Collection Time: 02/18/23  8:35 PM  Result Value Ref Range   WBC 14.0 (H) 4.0 - 10.5 K/uL   RBC 3.38 (L) 3.87 - 5.11 MIL/uL   Hemoglobin 10.3 (L) 12.0 - 15.0 g/dL   HCT 30.8 (L) 65.7 - 84.6 %   MCV 92.9 80.0 - 100.0 fL   MCH 30.5 26.0 - 34.0 pg   MCHC 32.8 30.0 - 36.0 g/dL   RDW 96.2 95.2 - 84.1 %   Platelets 218 150 - 400 K/uL   nRBC 0.0 0.0 - 0.2 %    Comment: Performed at [facility], [address], [place], [place] [address]  Comprehensive metabolic panel     Status: Abnormal   Collection Time: 02/18/23  8:35 PM  Result Value Ref Range   Sodium 136 135 - 145 mmol/L   Potassium 3.7 3.5 - 5.1 mmol/L   Chloride 105 98 - 111 mmol/L  CO2 20 (L) 22 - 32 mmol/L   Glucose, Bld 95 70 - 99 mg/dL    Comment: Glucose reference range applies only to samples taken after fasting for at least 8 hours.   BUN 13 6 - 20 mg/dL   Creatinine, Ser 6.44 0.44 - 1.00 mg/dL   Calcium 8.6 (L) 8.9 - 10.3 mg/dL   Total Protein 6.0 (L) 6.5 - 8.1 g/dL   Albumin 2.9 (L) 3.5 - 5.0 g/dL   AST 19 15 - 41 U/L   ALT 14 0 - 44 U/L   Alkaline Phosphatase 109 38 - 126 U/L   Total Bilirubin 0.4 <1.2 mg/dL   GFR, Estimated >03 >47 mL/min    Comment: (NOTE) Calculated using the CKD-EPI Creatinine Equation (2021)    Anion gap 11 5 - 15    Comment: Performed at [facility], [address], [place], [place] [address]  Type and screen [facility]     Status: None   Collection Time: 02/18/23  8:35 PM  Result Value Ref Range   ABO/RH(D) O POS    Antibody Screen NEG    Sample Expiration      02/21/2023,2359 Performed at [facility], [address], [place], [place] [address]   DIC Panel ONCE - STAT     Status: Abnormal   Collection Time: 02/18/23  8:36 PM  Result Value Ref Range   Prothrombin Time 13.8 11.4 - 15.2 seconds   INR 1.0 0.8 - 1.2    Comment: (NOTE) INR goal varies based on device and disease states.    aPTT 24 24 - 36 seconds   Fibrinogen 406 210 - 475 mg/dL    Comment: (NOTE) Fibrinogen results may be underestimated in patients receiving thrombolytic therapy.    D-Dimer, Quant 1.00 (H) 0.00 - 0.50 ug/mL-FEU    Comment: (NOTE) At the manufacturer cut-off value of 0.5 g/mL FEU, this assay has a negative predictive value of 95-100%.This assay is intended for use in conjunction with a clinical pretest probability (PTP) assessment model to exclude pulmonary embolism (PE) and deep venous thrombosis (DVT) in outpatients suspected of PE or DVT. Results should be correlated with clinical presentation.    Platelets 219 150 - 400 K/uL   Smear Review NO SCHISTOCYTES SEEN     Comment: Performed at [facility], [address], [place], [place] [address]    I have reviewed the patient's current medications.  ASSESSMENT: Principal Problem:   Vaginal bleeding in pregnancy, third trimester Active Problems:   Placenta previa antepartum, third trimester   PLAN: Continue inpatient hospital stay BPP and growth scan today.  Awaiting MFM input regarding time of delivery. Appreciate social work consult. Daily NST   Continue routine antenatal care.   Mariel Aloe, MD [facility] Faculty Attending, Center for [facility] 02/20/2023 8:44 AM

## 2023-02-21 DIAGNOSIS — O4693 Antepartum hemorrhage, unspecified, third trimester: Secondary | ICD-10-CM

## 2023-02-21 DIAGNOSIS — Z3A34 34 weeks gestation of pregnancy: Secondary | ICD-10-CM

## 2023-02-21 LAB — GC/CHLAMYDIA PROBE AMP (~~LOC~~) NOT AT ARMC
Chlamydia: NEGATIVE
Comment: NEGATIVE
Comment: NORMAL
Neisseria Gonorrhea: NEGATIVE

## 2023-02-21 NOTE — Progress Notes (Signed)
02/21/2023 8:57 AM Patient ID: Erica Horton, female   DOB: 10-Apr-1995, 27 y.o.   MRN: [id number] FACULTY PRACTICE ANTEPARTUM PROGRESS NOTE  Erica Horton is a 27 y.o. [id number] at [redacted]w[redacted]d who is admitted for third trimester vaginal bleeding.  Estimated Date of Delivery: 03/30/23 Fetal presentation is cephalic.  Length of Stay:  3 Days. Admitted 02/18/2023  Subjective: Patient reports feeling well today. She states she rested better last night. She reports occasional brown staining when she wipes. Patient reports normal fetal movement.  She denies uterine contractions, and leaking of fluid per vagina.  Vitals:  Blood pressure (!) 109/55, pulse 87, temperature 98.4 F (36.9 C), temperature source Oral, resp. rate 16, height 5\' 11"  (1.803 m), weight 77.1 kg, SpO2 100%, unknown if currently breastfeeding. Physical Examination: CONSTITUTIONAL: Well-developed, well-nourished female in no acute distress.  NEUROLGIC: Alert and oriented to person, place, and time. Normal reflexes, muscle tone coordination. No cranial nerve deficit noted. PSYCHIATRIC: Normal mood and affect. Normal behavior. Normal judgment and thought content. CARDIOVASCULAR: Normal heart rate noted, regular rhythm RESPIRATORY: Effort and breath sounds normal, no problems with respiration noted MUSCULOSKELETAL: Normal range of motion. No edema and no tenderness. ABDOMEN: Soft, nontender, nondistended, gravid. CERVIX: deferred  Fetal monitoring: FHR: 130s bpm, Variability: moderate, Accelerations: Present, Decelerations: Absent , category 1 strip Uterine activity: none  No results found for this or any previous visit (from the past 48 hours).   I have reviewed the patient's current medications.  ASSESSMENT: Principal Problem:   Vaginal bleeding in pregnancy, third trimester Active Problems:   Placenta previa antepartum, third trimester   PLAN: Continue inpatient hospital stay BPP 8/8 yesterday with EFW 2596 gm Patient  desires to be discharged home on Saturday after 7 days of observation Plan for delivery at 36 weeks or sooner for maternal/fetal indications Appreciate social work consult. Daily NST Continue routine antenatal care.   Catalina Antigua, MD [facility] Faculty Attending, Center for [facility]

## 2023-02-22 MED ORDER — LACTATED RINGERS IV BOLUS: 1000.0000 mL | Freq: Once | INTRAVENOUS | Status: DC

## 2023-02-22 NOTE — Progress Notes (Signed)
Patient ID: Erica Horton, female   DOB: 1995/08/15, 27 y.o.   MRN: [id number] 02/21/2023 8:57 AM Patient ID: Erica Horton, female   DOB: Jul 17, 1995, 27 y.o.   MRN: [id number] FACULTY PRACTICE ANTEPARTUM PROGRESS NOTE  Erica Horton is a 27 y.o. [id number] at [redacted]w[redacted]d who is admitted for third trimester vaginal bleeding.  Estimated Date of Delivery: 03/30/23 Fetal presentation is cephalic.  Length of Stay:  4 Days. Admitted 02/18/2023  Subjective: Patient reports feeling well today. She states she rested better last night. She reports occasional brown staining when she wipes. Patient reports normal fetal movement.  She denies uterine contractions, and leaking of fluid per vagina.  Vitals:  Blood pressure (!) 108/59, pulse 81, temperature 98.1 F (36.7 C), temperature source Oral, resp. rate 16, height 5\' 11"  (1.803 m), weight 77.1 kg, SpO2 100%, unknown if currently breastfeeding. Physical Examination: CONSTITUTIONAL: Well-developed, well-nourished female in no acute distress.  NEUROLGIC: Alert and oriented to person, place, and time. Normal reflexes, muscle tone coordination. No cranial nerve deficit noted. PSYCHIATRIC: Normal mood and affect. Normal behavior. Normal judgment and thought content. CARDIOVASCULAR: Normal heart rate noted, regular rhythm RESPIRATORY: Effort and breath sounds normal, no problems with respiration noted MUSCULOSKELETAL: Normal range of motion. No edema and no tenderness. ABDOMEN: Soft, nontender, nondistended, gravid. CERVIX: deferred  Fetal monitoring: FHR: 130s bpm, Variability: moderate, Accelerations: Present, Decelerations: Absent , category 1 strip Uterine activity: none  No results found for this or any previous visit (from the past 48 hours).   I have reviewed the patient's current medications.  ASSESSMENT: Principal Problem:   Vaginal bleeding in pregnancy, third trimester Active Problems:   Placenta previa antepartum, third  trimester   PLAN: Continue inpatient hospital stay BPP 8/8 02/20/23 with EFW 2596 gm Plan for discharge home on  Saturday after 7 days of observation Plan for delivery at 36 weeks or sooner for maternal/fetal indications Will schedule c-section if not already scheduled Daily NST Continue routine antenatal care.   Catalina Antigua, MD [facility] Faculty Attending, Center for [facility]

## 2023-02-23 DIAGNOSIS — O4403 Placenta previa specified as without hemorrhage, third trimester: Secondary | ICD-10-CM

## 2023-02-23 DIAGNOSIS — Z3A35 35 weeks gestation of pregnancy: Secondary | ICD-10-CM

## 2023-02-23 NOTE — Progress Notes (Signed)
FACULTY PRACTICE ANTEPARTUM(COMPREHENSIVE) NOTE  Erica Horton is a 27 y.o. [id number] with Estimated Date of Delivery: 03/30/23   By  best clinical estimate [redacted]w[redacted]d  who is admitted for vaginal bleeding third trimester with complete previa.    Fetal presentation is cephalic. Length of Stay:  5  Days  Date of admission:02/18/2023  Subjective: Patient resting comfortably overnight reports no acute complaints Patient reports the fetal movement as active. Patient reports uterine contraction  activity as none. Patient reports  vaginal bleeding as  notes occasional brown spotting when she voids . Patient describes fluid per vagina as None.  Vitals:  Blood pressure 115/61, pulse 86, temperature 97.7 F (36.5 C), resp. rate 16, height 5\' 11"  (1.803 m), weight 77.1 kg, SpO2 100%, unknown if currently breastfeeding. Vitals:   02/22/23 0820 02/22/23 1610 02/22/23 1937 02/23/23 0303  BP: (!) 108/59 124/72 115/61   Pulse: 81 89 86   Resp: 16 18 17 16   Temp: 98.1 F (36.7 C) 98.1 F (36.7 C) 97.8 F (36.6 C) 97.7 F (36.5 C)  TempSrc: Oral Oral Oral   SpO2: 100% 100%    Weight:      Height:       Physical Examination:  General appearance - alert, well appearing, and in no distress Mental status - normal mood, behavior, speech, dress, motor activity, and thought processes Chest - clear to auscultation, no wheezes, rales or rhonchi, symmetric air entry Heart - normal rate and regular rhythm Abdomen - gravid, soft and non-tender Musculoskeletal -no calf tenderness bilaterally Extremities - no pedal edema noted Skin - warm and dry   Fetal Monitoring:  Baseline: 130 bpm, Variability: moderate, Accelerations: +15x15, and Decelerations: Absent    Reactive  Toco: no contractions  Labs:  No results found for this or any previous visit (from the past 24 hours).  Imaging Studies:    [place] MFM OB FOLLOW UP Result Date:  02/20/2023 ----------------------------------------------------------------------  OBSTETRICS REPORT                       (Signed Final 02/20/2023 01:27 pm) ---------------------------------------------------------------------- Patient Info  ID #:       [id number]                          D.O.B.:  1995/07/22 (27 yrs)(F)  Name:       Erica Horton                 Visit Date: 02/20/2023 09:43 am ---------------------------------------------------------------------- Performed By  Attending:        Noralee Space MD        Referred By:      [facility]                                                             Care  Performed By:     Emeline Darling BS,      [facility] ---------------------------------------------------------------------- Orders  #  Description                           Code        Ordered By  1  [place] MFM OB FOLLOW UP                   76816.01    Erica Horton  2  [place] MFM FETAL BPP WO NON               76819.01    Erica Horton     STRESS ----------------------------------------------------------------------  #  Order #                     Accession #                Episode #  1  [id number]                   [id number]                 [id number]  2  [id number]                   [id number]                 [id number] ---------------------------------------------------------------------- Indications  Placenta previa with hemorrhage, third         O44.13  trimester  Encounter for other antenatal screening        Z36.2  follow-up  Late to prenatal care, third trimester         O09.33  Low Risk NIPS, Negative Horizon  [redacted] weeks gestation of pregnancy                Z3A.34 ---------------------------------------------------------------------- Fetal Evaluation  Num Of Fetuses:         1  Fetal Heart Rate(bpm):  149  Cardiac Activity:       Observed  Presentation:           Cephalic  Placenta:               Posterior  previa  P. Cord Insertion:      Previously seen  Amniotic Fluid  AFI FV:      Within normal limits  AFI Sum(cm)     %Tile       Largest Pocket(cm)  16.8            61          6.8  RUQ(cm)       RLQ(cm)       LUQ(cm)        LLQ(cm)  6.8           3.9           3              3.1  Comment:    No placental abruption identified. ---------------------------------------------------------------------- Biophysical Evaluation  Amniotic F.V:   Pocket => 2 cm             F. Tone:        Observed  F. Movement:    Observed                   Score:          8/8  F. Breathing:   Observed ---------------------------------------------------------------------- Biometry  BPD:      80.8  mm     G. Age:  32w 3d          5  %    CI:        71.21   %    70 - 86                                                          FL/HC:      22.9   %    20.1 - 22.3  HC:       305   mm     G. Age:  34w 0d          8  %    HC/AC:      0.96        0.93 - 1.11  AC:      316.4  mm     G. Age:  35w 4d         82  %    FL/BPD:     86.4   %    71 - 87  FL:       69.8  mm     G. Age:  35w 6d         74  %    FL/AC:      22.1   %    20 - 24  Est. FW:    2596  gm    5 lb 12 oz      62  % ---------------------------------------------------------------------- OB History  Gravidity:    5         Term:   3        Prem:   1 ---------------------------------------------------------------------- Gestational Age  Clinical EDD:  34w 4d                                        EDD:   03/30/23  U/S Today:     34w 3d                                        EDD:   03/31/23  Best:          34w 4d     Det. By:  Clinical EDD             EDD:   03/30/23 ---------------------------------------------------------------------- Anatomy  Cranium:               Appears normal         Aortic Arch:            Not well visualized  Cavum:                 Appears normal         Ductal Arch:            Not well visualized  Ventricles:            Appears normal         Diaphragm:               Appears normal  Choroid Plexus:  Previously seen        Stomach:                Appears normal, left                                                                        sided  Cerebellum:            Appears normal         Abdomen:                Appears normal  Posterior Fossa:       Appears normal         Abdominal Wall:         Previously seen  Nuchal Fold:           Not applicable (>20    Cord Vessels:           Previously seen                         wks GA)  Face:                  Orbits and profile     Kidneys:                Appear normal                         previously seen  Lips:                  Previously seen        Bladder:                Appears normal  Thoracic:              Appears normal         Spine:                  Previously seen  Heart:                 Appears normal         Upper Extremities:      Not well visualized                         (4CH, axis, and                         situs)  RVOT:                  Appears normal         Lower Extremities:      Not well visualized  LVOT:                  Appears normal  Other:  Technically difficult due to advanced gestational age. ---------------------------------------------------------------------- Cervix Uterus Adnexa  Cervix  Visualized ---------------------------------------------------------------------- Impression  Patient is admitted with vaginal bleeding and placenta previa  was confirmed on ultrasound.  Fetal growth is appropriate for gestational age. Amniotic fluid  is normal and good fetal activity is seen. Antenatal testing is  reassuring. BPP 8/8.  On transabdominal scan, placenta previa is seen again. ---------------------------------------------------------------------- Recommendations  -Consider delivery at 36 weeks if patient has intermittent or  mild vaginal bleeding.  -If patient has severe vaginal bleeding, she should be  delivered at any gestational age.  -Antenatal corticosteroids after discussing with the  patient. ----------------------------------------------------------------------                  Noralee Space, MD Electronically Signed Final Report   02/20/2023 01:27 pm ----------------------------------------------------------------------   [place] MFM FETAL BPP WO NON STRESS Result Date: 02/20/2023 ----------------------------------------------------------------------  OBSTETRICS REPORT                       (Signed Final 02/20/2023 01:27 pm) ---------------------------------------------------------------------- Patient Info  ID #:       [id number]                          D.O.B.:  1995-09-01 (27 yrs)(F)  Name:       Erica Horton                 Visit Date: 02/20/2023 09:43 am ---------------------------------------------------------------------- Performed By  Attending:        Noralee Space MD        Referred By:      [facility]                                                             Care  Performed By:     Emeline Darling BS,      [facility] ---------------------------------------------------------------------- Orders  #  Description                           Code        Ordered By  1  [place] MFM OB FOLLOW UP                   76816.01    Erica Horton  2  [place] MFM FETAL BPP WO NON               76819.01    Erica Horton     STRESS ----------------------------------------------------------------------  #  Order #                     Accession #                Episode #  1  [id number]                   [id number]                 [id number]  2  [id number]                   [id number]  [id number] ---------------------------------------------------------------------- Indications  Placenta previa with hemorrhage, third         O44.13  trimester  Encounter for other antenatal screening        Z36.2  follow-up  Late to prenatal care, third trimester         O09.33  Low Risk NIPS, Negative Horizon  [redacted] weeks  gestation of pregnancy                Z3A.34 ---------------------------------------------------------------------- Fetal Evaluation  Num Of Fetuses:         1  Fetal Heart Rate(bpm):  149  Cardiac Activity:       Observed  Presentation:           Cephalic  Placenta:               Posterior previa  P. Cord Insertion:      Previously seen  Amniotic Fluid  AFI FV:      Within normal limits  AFI Sum(cm)     %Tile       Largest Pocket(cm)  16.8            61          6.8  RUQ(cm)       RLQ(cm)       LUQ(cm)        LLQ(cm)  6.8           3.9           3              3.1  Comment:    No placental abruption identified. ---------------------------------------------------------------------- Biophysical Evaluation  Amniotic F.V:   Pocket => 2 cm             F. Tone:        Observed  F. Movement:    Observed                   Score:          8/8  F. Breathing:   Observed ---------------------------------------------------------------------- Biometry  BPD:      80.8  mm     G. Age:  32w 3d          5  %    CI:        71.21   %    70 - 86                                                          FL/HC:      22.9   %    20.1 - 22.3  HC:       305   mm     G. Age:  34w 0d          8  %    HC/AC:      0.96        0.93 - 1.11  AC:      316.4  mm     G. Age:  35w 4d         82  %    FL/BPD:     86.4   %    71 - 87  FL:       69.8  mm     G.  Age:  35w 6d         74  %    FL/AC:      22.1   %    20 - 24  Est. FW:    2596  gm    5 lb 12 oz      62  % ---------------------------------------------------------------------- OB History  Gravidity:    5         Term:   3        Prem:   1 ---------------------------------------------------------------------- Gestational Age  Clinical EDD:  34w 4d                                        EDD:   03/30/23  U/S Today:     34w 3d                                        EDD:   03/31/23  Best:          34w 4d     Det. By:  Clinical EDD             EDD:   03/30/23  ---------------------------------------------------------------------- Anatomy  Cranium:               Appears normal         Aortic Arch:            Not well visualized  Cavum:                 Appears normal         Ductal Arch:            Not well visualized  Ventricles:            Appears normal         Diaphragm:              Appears normal  Choroid Plexus:        Previously seen        Stomach:                Appears normal, left                                                                        sided  Cerebellum:            Appears normal         Abdomen:                Appears normal  Posterior Fossa:       Appears normal         Abdominal Wall:         Previously seen  Nuchal Fold:           Not applicable (>20    Cord Vessels:           Previously seen                         wks  GA)  Face:                  Orbits and profile     Kidneys:                Appear normal                         previously seen  Lips:                  Previously seen        Bladder:                Appears normal  Thoracic:              Appears normal         Spine:                  Previously seen  Heart:                 Appears normal         Upper Extremities:      Not well visualized                         (4CH, axis, and                         situs)  RVOT:                  Appears normal         Lower Extremities:      Not well visualized  LVOT:                  Appears normal  Other:  Technically difficult due to advanced gestational age. ---------------------------------------------------------------------- Cervix Uterus Adnexa  Cervix  Visualized ---------------------------------------------------------------------- Impression  Patient is admitted with vaginal bleeding and placenta previa  was confirmed on ultrasound.  Fetal growth is appropriate for gestational age. Amniotic fluid  is normal and good fetal activity is seen. Antenatal testing is  reassuring. BPP 8/8.  On transabdominal scan, placenta previa is seen  again. ---------------------------------------------------------------------- Recommendations  -Consider delivery at 36 weeks if patient has intermittent or  mild vaginal bleeding.  -If patient has severe vaginal bleeding, she should be  delivered at any gestational age.  -Antenatal corticosteroids after discussing with the patient. ----------------------------------------------------------------------                  Noralee Space, MD Electronically Signed Final Report   02/20/2023 01:27 pm ----------------------------------------------------------------------     Medications:  Scheduled  docusate sodium  100 mg Oral Daily   prenatal multivitamin  1 tablet Oral Q1200   sodium chloride flush  3-10 mL Intravenous Q12H   I have reviewed the patient's current medications.  ASSESSMENT: [id number] [redacted]w[redacted]d Estimated Date of Delivery: 03/30/23  Patient Active Problem List   Diagnosis Date Noted   Placenta previa antepartum, third trimester 02/19/2023   Vaginal bleeding in pregnancy, third trimester 02/18/2023   Supervision of low-risk pregnancy 01/19/2023   Late prenatal care affecting pregnancy in third trimester 01/19/2023    PLAN: 1) vaginal bleeding in setting of complete previa -No active bleeding, spotting continues to improve -No evidence of labor  2) Fetal well being BPP 8/8, 12/23 Reactive NST, continue daily monitoring  3) Maternal care Routine antenatal care  DISP: Continue inpatient hospital  stay with likely discharge on Saturday if maternal/fetal status remained stable.  Will plan to schedule for C-section 36-37wks  Sharon Seller 02/23/2023,7:32 AM

## 2023-02-24 NOTE — Progress Notes (Signed)
Patient ID: Erica Horton, female   DOB: 1995-11-10, 27 y.o.   MRN: [id number] FACULTY PRACTICE ANTEPARTUM(COMPREHENSIVE) NOTE  Erica Horton is a 27 y.o. Q6V7846 at [redacted]w[redacted]d by best clinical estimate who is admitted for placenta previa with bleeding.   Fetal presentation is cephalic. Length of Stay:  6  Days  Subjective: Scant spotting Patient reports the fetal movement as active. Patient reports uterine contraction  activity as none. Patient reports  vaginal bleeding as scant staining. Patient describes fluid per vagina as None.  Vitals:  Blood pressure 99/62, pulse 82, temperature 98.1 F (36.7 C), temperature source Oral, resp. rate 16, height 5\' 11"  (1.803 m), weight 77.1 kg, SpO2 98%, unknown if currently breastfeeding. Physical Examination:  General appearance - alert, well appearing, and in no distress Heart - normal rate and regular rhythm Abdomen - soft, nontender, nondistended Fundal Height:  size equals dates Cervical Exam: Not evaluated. Extremities: extremities normal, atraumatic, no cyanosis or edema and Homans sign is negative, no sign of DVT  Membranes:intact  Fetal Monitoring:   Fetal Heart Rate A   Mode Doppler filed at 02/23/2023 2330  Baseline Rate (A) 145 bpm filed at 02/23/2023 2330    Labs:  No results found for this or any previous visit (from the past 24 hours).   Medications:  Scheduled  docusate sodium  100 mg Oral Daily   prenatal multivitamin  1 tablet Oral Q1200   sodium chloride flush  3-10 mL Intravenous Q12H   I have reviewed the patient's current medications.  ASSESSMENT: Patient Active Problem List   Diagnosis Date Noted   Placenta previa antepartum, third trimester 02/19/2023   Vaginal bleeding in pregnancy, third trimester 02/18/2023   Supervision of low-risk pregnancy 01/19/2023   Late prenatal care affecting pregnancy in third trimester 01/19/2023    PLAN:  1) vaginal bleeding in setting of complete previa -No active  bleeding, spotting continues to improve -No evidence of labor   2) Fetal well being BPP 8/8, 12/23 Reactive NST, continue daily monitoring   3) Maternal care Routine antenatal care   DISP: Continue inpatient hospital stay with likely discharge on Saturday if maternal/fetal status remained stable.  Will plan to schedule for C-section 36-37wks  Scheryl Darter 02/24/2023,10:02 AM

## 2023-02-25 NOTE — Discharge Summary (Incomplete)
Patient ID: KEETA OMORI MRN: [id number] DOB/AGE: 22-Feb-1996 27 y.o.  Admit date: 02/18/2023 Discharge date: 02/25/2023  Admission Diagnoses:  Discharge Diagnoses:   Prenatal Procedures: {prenatal procedures:[id number]}  Consults: Neonatology, Maternal Fetal Medicine  Hospital Course:  This is a 27 y.o. [id number] with IUP at [redacted]w[redacted]d admitted for ***. She was admitted with contractions, noted to have a cervical exam of ***.  No leaking of fluid and no bleeding.  She was initially started on magnesium sulfate for tocolysis and neuroprotection and also received betamethasone x 2 doses.  Her tocolysis was transitioned to Procardia. She was seen by Neonatology during her stay.  She was observed, fetal heart rate monitoring remained reassuring, and she had no signs/symptoms of progressing preterm labor or other maternal-fetal concerns.  Her cervical exam was unchanged from admission.  She was deemed stable for discharge to home with outpatient follow up.  Discharge Exam: Temp:  [97.8 F (36.6 C)-98.4 F (36.9 C)] 98.1 F (36.7 C) (12/28 0837) Pulse Rate:  [73-97] 73 (12/28 0837) Resp:  [16-18] 18 (12/28 0837) BP: (113-123)/(67-75) 120/70 (12/28 0837) SpO2:  [100 %] 100 % (12/28 0837) Physical Examination: CONSTITUTIONAL: Well-developed, well-nourished female in no acute distress.  HENT:  Normocephalic, atraumatic, External right and left ear normal. Oropharynx is clear and moist EYES: Conjunctivae and EOM are normal. Pupils are equal, round, and reactive to light. No scleral icterus.  NECK: Normal range of motion, supple, no masses SKIN: Skin is warm and dry. No rash noted. Not diaphoretic. No erythema. No pallor. NEUROLGIC: Alert and oriented to person, place, and time. Normal reflexes, muscle tone coordination. No cranial nerve deficit noted. PSYCHIATRIC: Normal mood and affect. Normal behavior. Normal judgment and thought content. CARDIOVASCULAR: Normal heart rate noted, regular  rhythm RESPIRATORY: Effort and breath sounds normal, no problems with respiration noted MUSCULOSKELETAL: Normal range of motion. No edema and no tenderness. 2+ distal pulses. ABDOMEN: Soft, nontender, nondistended, gravid. CERVIX:    Fetal monitoring: FHR: 1*** bpm, Variability: moderate, Accelerations: Present, Decelerations: Absent  Uterine activity: *** contractions per hour  Significant Diagnostic Studies:  Results for orders placed or performed during the hospital encounter of 02/18/23 (from the past week)  Glucose, capillary   Collection Time: 02/18/23  7:20 PM  Result Value Ref Range   Glucose-Capillary 117 (H) 70 - 99 mg/dL  GC/Chlamydia probe amp ([place])not at [facility]   Collection Time: 02/18/23  8:09 PM  Result Value Ref Range   Neisseria Gonorrhea Negative    Chlamydia Negative    Comment Normal Reference Ranger Chlamydia - Negative    Comment      Normal Reference Range Neisseria Gonorrhea - Negative  Wet prep, genital   Collection Time: 02/18/23  8:11 PM  Result Value Ref Range   Yeast Wet Prep HPF POC NONE SEEN NONE SEEN   Trich, Wet Prep NONE SEEN NONE SEEN   Clue Cells Wet Prep HPF POC NONE SEEN NONE SEEN   WBC, Wet Prep HPF POC <10 <10   Sperm NONE SEEN   CBC   Collection Time: 02/18/23  8:35 PM  Result Value Ref Range   WBC 14.0 (H) 4.0 - 10.5 K/uL   RBC 3.38 (L) 3.87 - 5.11 MIL/uL   Hemoglobin 10.3 (L) 12.0 - 15.0 g/dL   HCT 13.0 (L) 86.5 - 78.4 %   MCV 92.9 80.0 - 100.0 fL   MCH 30.5 26.0 - 34.0 pg   MCHC 32.8 30.0 - 36.0 g/dL   RDW 69.6 29.5 -  15.5 %   Platelets 218 150 - 400 K/uL   nRBC 0.0 0.0 - 0.2 %  Comprehensive metabolic panel   Collection Time: 02/18/23  8:35 PM  Result Value Ref Range   Sodium 136 135 - 145 mmol/L   Potassium 3.7 3.5 - 5.1 mmol/L   Chloride 105 98 - 111 mmol/L   CO2 20 (L) 22 - 32 mmol/L   Glucose, Bld 95 70 - 99 mg/dL   BUN 13 6 - 20 mg/dL   Creatinine, Ser 7.84 0.44 - 1.00 mg/dL   Calcium 8.6 (L) 8.9 - 10.3  mg/dL   Total Protein 6.0 (L) 6.5 - 8.1 g/dL   Albumin 2.9 (L) 3.5 - 5.0 g/dL   AST 19 15 - 41 U/L   ALT 14 0 - 44 U/L   Alkaline Phosphatase 109 38 - 126 U/L   Total Bilirubin 0.4 <1.2 mg/dL   GFR, Estimated >69 >62 mL/min   Anion gap 11 5 - 15  Type and screen [facility]   Collection Time: 02/18/23  8:35 PM  Result Value Ref Range   ABO/RH(D) O POS    Antibody Screen NEG    Sample Expiration      02/21/2023,2359 Performed at [facility], [address], [place], [place] [address]   DIC Panel ONCE - STAT   Collection Time: 02/18/23  8:36 PM  Result Value Ref Range   Prothrombin Time 13.8 11.4 - 15.2 seconds   INR 1.0 0.8 - 1.2   aPTT 24 24 - 36 seconds   Fibrinogen 406 210 - 475 mg/dL   D-Dimer, Quant 1.32 (H) 0.00 - 0.50 ug/mL-FEU   Platelets 219 150 - 400 K/uL   Smear Review NO SCHISTOCYTES SEEN     Discharge Condition: Stable  Disposition: Discharge disposition: 01-Home or Self Care        Discharge Instructions     Discharge patient   Complete by: As directed    Discharge disposition: 01-Home or Self Care   Discharge patient date: 02/25/2023      Allergies as of 02/25/2023       Reactions   Coconut (cocos Nucifera) Rash        Medication List     TAKE these medications    PrePLUS 27-1 MG Tabs Take 1 tablet by mouth daily.        Follow-up Information     Federico Flake, MD. Call in 2 day(s).   Specialty: Obstetrics and Gynecology Why: Cesarean section scheduled 1/2 at 1500 Contact information: [address] [place] [address] [phone]                 Signed: Scheryl Darter M.D. 02/25/2023, 11:57 AM

## 2023-02-26 ENCOUNTER — Inpatient Hospital Stay (HOSPITAL_COMMUNITY): Payer: Medicaid Other | Admitting: Anesthesiology

## 2023-02-26 ENCOUNTER — Encounter (HOSPITAL_COMMUNITY): Payer: Self-pay | Admitting: Obstetrics & Gynecology

## 2023-02-26 ENCOUNTER — Inpatient Hospital Stay (HOSPITAL_COMMUNITY)
Admission: AD | Admit: 2023-02-26 | Discharge: 2023-03-01 | DRG: 787 | Disposition: A | Discharge status: Home / Self Care | Payer: Medicaid Other | Attending: Obstetrics & Gynecology | Admitting: Obstetrics & Gynecology

## 2023-02-26 ENCOUNTER — Encounter (HOSPITAL_COMMUNITY)
Admission: AD | Disposition: A | Discharge status: Home / Self Care | Payer: Self-pay | Source: Home / Self Care | Attending: Pharmacy Technician

## 2023-02-26 DIAGNOSIS — O26893 Other specified pregnancy related conditions, third trimester: Secondary | ICD-10-CM | POA: Diagnosis present

## 2023-02-26 DIAGNOSIS — D62 Acute posthemorrhagic anemia: Secondary | ICD-10-CM | POA: Diagnosis not present

## 2023-02-26 DIAGNOSIS — O4593 Premature separation of placenta, unspecified, third trimester: Secondary | ICD-10-CM | POA: Diagnosis present

## 2023-02-26 DIAGNOSIS — O9081 Anemia of the puerperium: Secondary | ICD-10-CM | POA: Diagnosis not present

## 2023-02-26 DIAGNOSIS — Z3A36 36 weeks gestation of pregnancy: Secondary | ICD-10-CM | POA: Diagnosis not present

## 2023-02-26 DIAGNOSIS — O4403 Placenta previa specified as without hemorrhage, third trimester: Secondary | ICD-10-CM

## 2023-02-26 DIAGNOSIS — Z8249 Family history of ischemic heart disease and other diseases of the circulatory system: Secondary | ICD-10-CM

## 2023-02-26 DIAGNOSIS — Z98891 History of uterine scar from previous surgery: Secondary | ICD-10-CM

## 2023-02-26 DIAGNOSIS — O4400 Placenta previa specified as without hemorrhage, unspecified trimester: Secondary | ICD-10-CM | POA: Diagnosis present

## 2023-02-26 DIAGNOSIS — Z5941 Food insecurity: Secondary | ICD-10-CM | POA: Diagnosis not present

## 2023-02-26 DIAGNOSIS — Z5982 Transportation insecurity: Secondary | ICD-10-CM

## 2023-02-26 DIAGNOSIS — O4413 Placenta previa with hemorrhage, third trimester: Secondary | ICD-10-CM

## 2023-02-26 DIAGNOSIS — Z3A35 35 weeks gestation of pregnancy: Principal | ICD-10-CM

## 2023-02-26 LAB — CBC
HCT: 21.2 % — ABNORMAL LOW (ref 36.0–46.0)
Hemoglobin: 6.7 g/dL — CL (ref 12.0–15.0)
MCH: 30 pg (ref 26.0–34.0)
MCHC: 31.6 g/dL (ref 30.0–36.0)
MCV: 95.1 fL (ref 80.0–100.0)
Platelets: 286 10*3/uL (ref 150–400)
RBC: 2.23 MIL/uL — ABNORMAL LOW (ref 3.87–5.11)
RDW: 14.3 % (ref 11.5–15.5)
WBC: 11.8 10*3/uL — ABNORMAL HIGH (ref 4.0–10.5)
nRBC: 0.3 % — ABNORMAL HIGH (ref 0.0–0.2)

## 2023-02-26 LAB — CREATININE, SERUM
Creatinine, Ser: 0.74 mg/dL (ref 0.44–1.00)
GFR, Estimated: >60 mL/min (ref >60)

## 2023-02-26 LAB — PREPARE RBC (CROSSMATCH)

## 2023-02-26 SURGERY — Surgical Case
Anesthesia: Spinal

## 2023-02-26 MED ORDER — OXYCODONE HCL 5 MG PO TABS
5.0000 mg | ORAL_TABLET | ORAL | Status: DC | PRN
  Administered 2023-02-27: 10 mg via ORAL
  Administered 2023-02-27: 5 mg via ORAL
  Administered 2023-02-28 (×2): 10 mg via ORAL
  Filled 2023-02-26: qty 1
  Filled 2023-02-26 (×3): qty 2

## 2023-02-26 MED ORDER — OXYTOCIN-SODIUM CHLORIDE 30-0.9 UT/500ML-% IV SOLN
INTRAVENOUS | Status: DC | PRN
  Administered 2023-02-26: 300 mL via INTRAVENOUS

## 2023-02-26 MED ORDER — ACETAMINOPHEN 10 MG/ML IV SOLN
INTRAVENOUS | Status: DC | PRN
Start: 2023-02-26 — End: 2023-02-26
  Administered 2023-02-26: 1000 mg via INTRAVENOUS

## 2023-02-26 MED ORDER — FENTANYL CITRATE (PF) 100 MCG/2ML IJ SOLN
INTRAMUSCULAR | Status: DC | PRN
  Administered 2023-02-26: 15 ug via INTRAVENOUS

## 2023-02-26 MED ORDER — ONDANSETRON HCL 4 MG/2ML IJ SOLN
INTRAMUSCULAR | Status: DC | PRN
  Administered 2023-02-26: 4 mg via INTRAVENOUS

## 2023-02-26 MED ORDER — MORPHINE SULFATE (PF) 0.5 MG/ML IJ SOLN
INTRAMUSCULAR | Status: AC
  Filled 2023-02-26: qty 10

## 2023-02-26 MED ORDER — POVIDONE-IODINE 10 % EX SWAB: 2.0000 | Freq: Once | CUTANEOUS | Status: DC

## 2023-02-26 MED ORDER — LACTATED RINGERS IV SOLN: INTRAVENOUS | Status: DC

## 2023-02-26 MED ORDER — MENTHOL 3 MG MT LOZG: 1.0000 | LOZENGE | OROMUCOSAL | Status: DC | PRN

## 2023-02-26 MED ORDER — TRANEXAMIC ACID-NACL 1000-0.7 MG/100ML-% IV SOLN
1000.0000 mg | Freq: Once | INTRAVENOUS | Status: AC
  Administered 2023-02-26: 1000 mg via INTRAVENOUS
  Filled 2023-02-26: qty 100

## 2023-02-26 MED ORDER — ONDANSETRON HCL 4 MG/2ML IJ SOLN
INTRAMUSCULAR | Status: AC
Start: 2023-02-26 — End: ?
  Filled 2023-02-26: qty 2

## 2023-02-26 MED ORDER — WITCH HAZEL-GLYCERIN EX PADS
1.0000 | MEDICATED_PAD | CUTANEOUS | Status: DC | PRN
  Administered 2023-03-01: 1 via TOPICAL

## 2023-02-26 MED ORDER — SOD CITRATE-CITRIC ACID 500-334 MG/5ML PO SOLN
30.0000 mL | Freq: Once | ORAL | Status: DC
  Filled 2023-02-26: qty 30

## 2023-02-26 MED ORDER — ACETAMINOPHEN 500 MG PO TABS: 1000.0000 mg | ORAL_TABLET | Freq: Once | ORAL | Status: DC | PRN

## 2023-02-26 MED ORDER — BUPIVACAINE IN DEXTROSE 0.75-8.25 % IT SOLN
INTRATHECAL | Status: DC | PRN
  Administered 2023-02-26: 1.8 mL via INTRATHECAL

## 2023-02-26 MED ORDER — ACETAMINOPHEN 500 MG PO TABS
1000.0000 mg | ORAL_TABLET | Freq: Four times a day (QID) | ORAL | Status: DC
  Administered 2023-02-26 – 2023-03-01 (×10): 1000 mg via ORAL
  Filled 2023-02-26 (×10): qty 2

## 2023-02-26 MED ORDER — SODIUM CHLORIDE 0.9% IV SOLUTION: Freq: Once | INTRAVENOUS | Status: AC

## 2023-02-26 MED ORDER — MEDROXYPROGESTERONE ACETATE 150 MG/ML IM SUSP: 150.0000 mg | INTRAMUSCULAR | Status: DC | PRN

## 2023-02-26 MED ORDER — LACTATED RINGERS IV SOLN
INTRAVENOUS | Status: AC
Start: 2023-02-26 — End: 2023-02-27

## 2023-02-26 MED ORDER — LACTATED RINGERS IV BOLUS
1000.0000 mL | Freq: Once | INTRAVENOUS | Status: AC
  Administered 2023-02-26: 1000 mL via INTRAVENOUS

## 2023-02-26 MED ORDER — DEXMEDETOMIDINE HCL IN NACL 80 MCG/20ML IV SOLN
INTRAVENOUS | Status: DC | PRN
  Administered 2023-02-26 (×3): 8 ug via INTRAVENOUS

## 2023-02-26 MED ORDER — TETANUS-DIPHTH-ACELL PERTUSSIS 5-2.5-18.5 LF-MCG/0.5 IM SUSY: 0.5000 mL | PREFILLED_SYRINGE | Freq: Once | INTRAMUSCULAR | Status: DC

## 2023-02-26 MED ORDER — SIMETHICONE 80 MG PO CHEW
80.0000 mg | CHEWABLE_TABLET | ORAL | Status: DC | PRN
Start: 2023-02-26 — End: 2023-03-01

## 2023-02-26 MED ORDER — PHENYLEPHRINE HCL-NACL 20-0.9 MG/250ML-% IV SOLN
INTRAVENOUS | Status: DC | PRN
  Administered 2023-02-26: 60 ug/min via INTRAVENOUS

## 2023-02-26 MED ORDER — DEXAMETHASONE SODIUM PHOSPHATE 10 MG/ML IJ SOLN
INTRAMUSCULAR | Status: DC | PRN
  Administered 2023-02-26: 10 mg via INTRAVENOUS

## 2023-02-26 MED ORDER — DIBUCAINE (PERIANAL) 1 % EX OINT
1.0000 | TOPICAL_OINTMENT | CUTANEOUS | Status: DC | PRN
  Administered 2023-03-01: 1 via RECTAL
  Filled 2023-02-26 (×2): qty 28

## 2023-02-26 MED ORDER — CEFAZOLIN SODIUM-DEXTROSE 2-4 GM/100ML-% IV SOLN
2.0000 g | INTRAVENOUS | Status: AC
  Administered 2023-02-26: 2 g via INTRAVENOUS
  Filled 2023-02-26: qty 100

## 2023-02-26 MED ORDER — GABAPENTIN 100 MG PO CAPS
200.0000 mg | ORAL_CAPSULE | Freq: Every day | ORAL | Status: DC
  Administered 2023-02-26 – 2023-02-28 (×3): 200 mg via ORAL
  Filled 2023-02-26 (×3): qty 2

## 2023-02-26 MED ORDER — ACETAMINOPHEN 10 MG/ML IV SOLN: 1000.0000 mg | Freq: Once | INTRAVENOUS | Status: DC | PRN

## 2023-02-26 MED ORDER — KETOROLAC TROMETHAMINE 30 MG/ML IJ SOLN
30.0000 mg | Freq: Four times a day (QID) | INTRAMUSCULAR | Status: AC
  Administered 2023-02-26 – 2023-02-27 (×2): 30 mg via INTRAVENOUS
  Filled 2023-02-26 (×2): qty 1

## 2023-02-26 MED ORDER — LACTATED RINGERS IV SOLN: INTRAVENOUS | Status: DC | PRN

## 2023-02-26 MED ORDER — FENTANYL CITRATE (PF) 100 MCG/2ML IJ SOLN: 25.0000 ug | INTRAMUSCULAR | Status: DC | PRN

## 2023-02-26 MED ORDER — DIPHENHYDRAMINE HCL 25 MG PO CAPS: 25.0000 mg | ORAL_CAPSULE | Freq: Four times a day (QID) | ORAL | Status: DC | PRN

## 2023-02-26 MED ORDER — MAGNESIUM HYDROXIDE 400 MG/5ML PO SUSP: 30.0000 mL | ORAL | Status: DC | PRN

## 2023-02-26 MED ORDER — IBUPROFEN 600 MG PO TABS
600.0000 mg | ORAL_TABLET | Freq: Four times a day (QID) | ORAL | Status: DC
Start: 2023-02-27 — End: 2023-03-01
  Administered 2023-02-27 – 2023-03-01 (×8): 600 mg via ORAL
  Filled 2023-02-26 (×8): qty 1

## 2023-02-26 MED ORDER — SIMETHICONE 80 MG PO CHEW
80.0000 mg | CHEWABLE_TABLET | Freq: Three times a day (TID) | ORAL | Status: DC
  Administered 2023-02-26 – 2023-03-01 (×6): 80 mg via ORAL
  Filled 2023-02-26 (×7): qty 1

## 2023-02-26 MED ORDER — FENTANYL CITRATE (PF) 100 MCG/2ML IJ SOLN
INTRAMUSCULAR | Status: AC
  Filled 2023-02-26: qty 2

## 2023-02-26 MED ORDER — BUPIVACAINE HCL (PF) 0.5 % IJ SOLN
INTRAMUSCULAR | Status: AC
  Filled 2023-02-26: qty 30

## 2023-02-26 MED ORDER — ZOLPIDEM TARTRATE 5 MG PO TABS
5.0000 mg | ORAL_TABLET | Freq: Every evening | ORAL | Status: DC | PRN
Start: 2023-02-26 — End: 2023-03-01

## 2023-02-26 MED ORDER — SENNOSIDES-DOCUSATE SODIUM 8.6-50 MG PO TABS
2.0000 | ORAL_TABLET | Freq: Every day | ORAL | Status: DC
  Administered 2023-02-27 – 2023-03-01 (×3): 2 via ORAL
  Filled 2023-02-26 (×3): qty 2

## 2023-02-26 MED ORDER — MEASLES, MUMPS & RUBELLA VAC IJ SOLR: 0.5000 mL | Freq: Once | INTRAMUSCULAR | Status: DC

## 2023-02-26 MED ORDER — MORPHINE SULFATE (PF) 0.5 MG/ML IJ SOLN
INTRAMUSCULAR | Status: DC | PRN
  Administered 2023-02-26: 150 ug via EPIDURAL

## 2023-02-26 MED ORDER — ACETAMINOPHEN 160 MG/5ML PO SOLN: 1000.0000 mg | Freq: Once | ORAL | Status: DC | PRN

## 2023-02-26 MED ORDER — BUPIVACAINE HCL (PF) 0.5 % IJ SOLN
INTRAMUSCULAR | Status: DC | PRN
  Administered 2023-02-26: 10 mL

## 2023-02-26 MED ORDER — OXYTOCIN-SODIUM CHLORIDE 30-0.9 UT/500ML-% IV SOLN: 2.5000 [IU]/h | INTRAVENOUS | Status: AC

## 2023-02-26 MED ORDER — SODIUM CHLORIDE 0.9 % IV SOLN: INTRAVENOUS | Status: DC | PRN

## 2023-02-26 MED ORDER — PRENATAL MULTIVITAMIN CH
1.0000 | ORAL_TABLET | Freq: Every day | ORAL | Status: DC
  Administered 2023-02-27 – 2023-03-01 (×3): 1 via ORAL
  Filled 2023-02-26 (×3): qty 1

## 2023-02-26 MED ORDER — ENOXAPARIN SODIUM 40 MG/0.4ML IJ SOSY
40.0000 mg | PREFILLED_SYRINGE | INTRAMUSCULAR | Status: DC
  Administered 2023-02-28 – 2023-03-01 (×2): 40 mg via SUBCUTANEOUS
  Filled 2023-02-26 (×2): qty 0.4

## 2023-02-26 SURGICAL SUPPLY — 29 items
BARRIER ADHS 3X4 INTERCEED (GAUZE/BANDAGES/DRESSINGS) IMPLANT
BENZOIN TINCTURE PRP APPL 2/3 (GAUZE/BANDAGES/DRESSINGS) IMPLANT
CHLORAPREP W/TINT 26 (MISCELLANEOUS) ×4 IMPLANT
CLAMP UMBILICAL CORD (MISCELLANEOUS) ×2 IMPLANT
CLOTH BEACON ORANGE TIMEOUT ST (SAFETY) ×2 IMPLANT
DRSG OPSITE POSTOP 4X10 (GAUZE/BANDAGES/DRESSINGS) ×2 IMPLANT
ELECT REM PT RETURN 9FT ADLT (ELECTROSURGICAL) ×1
ELECTRODE REM PT RTRN 9FT ADLT (ELECTROSURGICAL) ×2 IMPLANT
EXTRACTOR VACUUM KIWI (MISCELLANEOUS) IMPLANT
GLOVE BIO SURGEON STRL SZ 6.5 (GLOVE) ×2 IMPLANT
GLOVE BIOGEL PI IND STRL 7.0 (GLOVE) ×4 IMPLANT
GOWN STRL REUS W/TWL LRG LVL3 (GOWN DISPOSABLE) ×4 IMPLANT
KIT ABG SYR 3ML LUER SLIP (SYRINGE) IMPLANT
NDL HYPO 25X5/8 SAFETYGLIDE (NEEDLE) IMPLANT
NEEDLE HYPO 22GX1.5 SAFETY (NEEDLE) IMPLANT
NEEDLE HYPO 25X5/8 SAFETYGLIDE (NEEDLE) IMPLANT
NS IRRIG 1000ML POUR BTL (IV SOLUTION) ×2 IMPLANT
PACK C SECTION WH (CUSTOM PROCEDURE TRAY) ×2 IMPLANT
PAD OB MATERNITY 4.3X12.25 (PERSONAL CARE ITEMS) ×2 IMPLANT
RETRACTOR WND ALEXIS 25 LRG (MISCELLANEOUS) IMPLANT
RTRCTR WOUND ALEXIS 25CM LRG (MISCELLANEOUS)
STRIP CLOSURE SKIN 1/2X4 (GAUZE/BANDAGES/DRESSINGS) IMPLANT
SUT VIC AB 0 CT1 36 (SUTURE) ×12 IMPLANT
SUT VIC AB 2-0 CT1 TAPERPNT 27 (SUTURE) ×2 IMPLANT
SUT VIC AB 4-0 PS2 27 (SUTURE) ×2 IMPLANT
SYR CONTROL 10ML LL (SYRINGE) IMPLANT
TOWEL OR 17X24 6PK STRL BLUE (TOWEL DISPOSABLE) ×2 IMPLANT
TRAY FOLEY W/BAG SLVR 14FR LF (SET/KITS/TRAYS/PACK) IMPLANT
WATER STERILE IRR 1000ML POUR (IV SOLUTION) ×2 IMPLANT

## 2023-02-26 NOTE — Anesthesia Preprocedure Evaluation (Signed)
Anesthesia Evaluation  Patient identified by MRN, date of birth, ID band Patient awake    Reviewed: Allergy & Precautions, H&P , NPO status , Patient's Chart, lab work & pertinent test results  Airway Mallampati: II  TM Distance: >3 FB Neck ROM: Full    Dental  (+) Dental Advisory Given, Teeth Intact   Pulmonary neg pulmonary ROS   breath sounds clear to auscultation       Cardiovascular negative cardio ROS  Rhythm:Regular     Neuro/Psych negative neurological ROS  negative psych ROS   GI/Hepatic negative GI ROS, Neg liver ROS,,,  Endo/Other  negative endocrine ROS    Renal/GU negative Renal ROS     Musculoskeletal negative musculoskeletal ROS (+)    Abdominal   Peds  Hematology  (+) Blood dyscrasia, anemia Lab Results      Component                Value               Date                      WBC                      11.8 (H)            02/26/2023                HGB                      6.7 (LL)            02/26/2023                HCT                      21.2 (L)            02/26/2023                MCV                      95.1                02/26/2023                PLT                      286                 02/26/2023              Anesthesia Other Findings   Reproductive/Obstetrics                             Anesthesia Physical Anesthesia Plan  ASA: 3 and emergent  Anesthesia Plan: Spinal   Post-op Pain Management:    Induction:   PONV Risk Score and Plan: 2 and Ondansetron  Airway Management Planned: Natural Airway  Additional Equipment: None  Intra-op Plan:   Post-operative Plan:   Informed Consent: I have reviewed the patients History and Physical, chart, labs and discussed the procedure including the risks, benefits and alternatives for the proposed anesthesia with the patient or authorized representative who has indicated his/her understanding and acceptance.      Dental advisory given  Plan Discussed with: CRNA  Anesthesia Plan Comments:  Anesthesia Quick Evaluation

## 2023-02-26 NOTE — H&P (Signed)
Obstetric Preoperative History and Physical  Erica Horton is a 27 y.o. [id number] with IUP at [redacted]w[redacted]d presenting for scheduled cesarean section.  Reports good fetal movement, no bleeding, no contractions, no leaking of fluid.  No acute preoperative concerns.    Cesarean Section Indication: placenta previa and active vaginal bleeding  Prenatal Course Source of Care: [facility]  with onset of care at 31 weeks Pregnancy complications or risks: Patient Active Problem List   Diagnosis Date Noted   Placenta previa antepartum, third trimester 02/19/2023   Vaginal bleeding in pregnancy, third trimester 02/18/2023   Supervision of low-risk pregnancy 01/19/2023   Late prenatal care affecting pregnancy in third trimester 01/19/2023   She plans breast and bottle feeding She is undecided for postpartum contraception.   Prenatal labs and studies: ABO, Rh: --/--/O POS (12/21 2035) Antibody: NEG (12/21 2035) Rubella: 2.66 (11/17 1903) RPR: NON REACTIVE (11/17 1903)  HBsAg: NON REACTIVE (11/17 1903)  HIV: Non Reactive (11/17 1903)  GBS: unknown 2 hr Glucola  normal Genetic screening normal Anatomy US normal  Prenatal Transfer Tool  Maternal Diabetes: No Genetic Screening: Normal Maternal Ultrasounds/Referrals: Other: Placenta previa Fetal Ultrasounds or other Referrals:  None Maternal Substance Abuse:  No Significant Maternal Medications:  None Significant Maternal Lab Results: Other:   Past Medical History:  Diagnosis Date   Chlamydia infection affecting pregnancy in third trimester    Gastritis    Gestational diabetes     Past Surgical History:  Procedure Laterality Date   HERNIA REPAIR      OB History  Gravida Para Term Preterm AB Living  5 3 3  0 1 3  SAB IAB Ectopic Multiple Live Births  1   0 3    # Outcome Date GA Lbr Len/2nd Weight Sex Type Anes PTL Lv  5 Current           4 Term 04/15/21 [redacted]w[redacted]d 06:11 / 00:02 3280 g M Vag-Spont None  LIV  3 SAB 06/02/20 [redacted]w[redacted]d         2 Term  10/01/19 [redacted]w[redacted]d 10:48 / 00:07 3090 g F Vag-Spont EPI  LIV  1 Term 03/03/16 [redacted]w[redacted]d 14:02 / 00:32 3405 g M Vag-Spont EPI  LIV    Social History   Socioeconomic History   Marital status: Single    Spouse name: Not on file   Number of children: Not on file   Years of education: Not on file   Highest education level: Not on file  Occupational History   Not on file  Tobacco Use   Smoking status: Never   Smokeless tobacco: Never  Vaping Use   Vaping status: Never Used  Substance and Sexual Activity   Alcohol use: No   Drug use: No   Sexual activity: Not Currently  Other Topics Concern   Not on file  Social History Narrative   Not on file   Social Drivers of Health   Financial Resource Strain: Not on file  Food Insecurity: Food Insecurity Present (02/19/2023)   Hunger Vital Sign    Worried About Running Out of Food in the Last Year: Sometimes true    Ran Out of Food in the Last Year: Sometimes true  Transportation Needs: Unmet Transportation Needs (02/19/2023)   PRAPARE - Administrator, Civil Service (Medical): Yes    Lack of Transportation (Non-Medical): Yes  Physical Activity: Not on file  Stress: Not on file  Social Connections: Not on file    Family History  Problem  Relation Age of Onset   Hypertension Father     Medications Prior to Admission  Medication Sig Dispense Refill Last Dose/Taking   Prenatal Vit-Fe Fumarate-FA (PREPLUS) 27-1 MG TABS Take 1 tablet by mouth daily. 30 tablet 13     Allergies  Allergen Reactions   Coconut (Cocos Nucifera) Rash    Review of Systems: Pertinent items noted in HPI and remainder of comprehensive ROS otherwise negative.  Physical Exam: BP 131/74   Pulse 97   Temp 98 F (36.7 C) (Oral)   Resp 16   SpO2 100%  EFM: Cat I CONSTITUTIONAL: Well-developed, well-nourished female in no acute distress.  HENT:  Normocephalic, atraumatic, External right and left ear normal. Oropharynx is clear and moist EYES:  Conjunctivae and EOM are normal. Pupils are equal, round, and reactive to light. No scleral icterus.  NECK: Normal range of motion, supple, no masses SKIN: Skin is warm and dry. No rash noted. Not diaphoretic. No erythema. No pallor. NEUROLOGIC: Alert and oriented to person, place, and time. Normal reflexes, muscle tone coordination. No cranial nerve deficit noted. PSYCHIATRIC: Normal mood and affect. Normal behavior. Normal judgment and thought content. CARDIOVASCULAR: Normal heart rate noted, regular rhythm RESPIRATORY: Effort and breath sounds normal, no problems with respiration noted ABDOMEN: Soft, nontender, nondistended, gravid.  PELVIC: Deferred MUSCULOSKELETAL: Normal range of motion. No edema and no tenderness. 2+ distal pulses.  Pertinent Labs/Studies:   No results found for this or any previous visit (from the past 72 hours).  Assessment and Plan: Erica Horton is a 27 y.o. [id number] at [redacted]w[redacted]d being admitted for scheduled cesarean section. The risks of surgery were discussed with the patient including but were not limited to: bleeding which may require transfusion or reoperation; infection which may require antibiotics; injury to bowel, bladder, ureters or other surrounding organs; injury to the fetus; need for additional procedures including hysterectomy in the event of a life-threatening hemorrhage; formation of adhesions; placental abnormalities wth subsequent pregnancies; incisional problems; thromboembolic phenomenon and other postoperative/anesthesia complications. The patient concurred with the proposed plan, giving informed written consent for the procedure. Patient has been NPO since [id number] this morning she will remain NPO for procedure. Anesthesia and OR aware. Preoperative prophylactic antibiotics and SCDs ordered on call to the OR. To OR when ready.    Wyn Forster, MD FMOB Fellow, Faculty practice [facility], Center for [facility] 02/26/23  12:17 PM

## 2023-02-26 NOTE — MAU Provider Note (Signed)
Chief Complaint:  Abdominal Pain and Vaginal Bleeding   Event Date/Time   First Provider Initiated Contact with Patient 02/26/23 1156     HPI  HPI: Erica Horton is a 27 y.o. [id number] at 12w3dwho presents to maternity admissions reporting bright red vaginal bleeding x 45 minutes.  She reports good fetal movement, denies LOF, vaginal bleeding, vaginal itching/burning, urinary symptoms, h/a, dizziness, n/v, diarrhea, constipation or fever/chills.  She denies headache, visual changes or RUQ abdominal pain.   Past Medical History: Past Medical History:  Diagnosis Date   Chlamydia infection affecting pregnancy in third trimester    Gastritis    Gestational diabetes     Past obstetric history: OB History  Gravida Para Term Preterm AB Living  5 3 3  0 1 3  SAB IAB Ectopic Multiple Live Births  1   0 3    # Outcome Date GA Lbr Len/2nd Weight Sex Type Anes PTL Lv  5 Current           4 Term 04/15/21 [redacted]w[redacted]d 06:11 / 00:02 3280 g M Vag-Spont None  LIV  3 SAB 06/02/20 [redacted]w[redacted]d         2 Term 10/01/19 [redacted]w[redacted]d 10:48 / 00:07 3090 g F Vag-Spont EPI  LIV  1 Term 03/03/16 [redacted]w[redacted]d 14:02 / 00:32 3405 g M Vag-Spont EPI  LIV    Past Surgical History: Past Surgical History:  Procedure Laterality Date   HERNIA REPAIR      Family History: Family History  Problem Relation Age of Onset   Hypertension Father     Social History: Social History   Tobacco Use   Smoking status: Never   Smokeless tobacco: Never  Vaping Use   Vaping status: Never Used  Substance Use Topics   Alcohol use: No   Drug use: No    Allergies:  Allergies  Allergen Reactions   Coconut (Cocos Nucifera) Rash    Meds:  Medications Prior to Admission  Medication Sig Dispense Refill Last Dose/Taking   Prenatal Vit-Fe Fumarate-FA (PREPLUS) 27-1 MG TABS Take 1 tablet by mouth daily. 30 tablet 13     I have reviewed patient's Past Medical Hx, Surgical Hx, Family Hx, Social Hx, medications and allergies.   ROS:  Review  of Systems Other systems negative  Physical Exam  Patient Vitals for the past 24 hrs:  BP Temp Temp src Pulse Resp SpO2  02/26/23 1159 131/74 98 F (36.7 C) Oral 97 16 --  02/26/23 1158 -- -- -- -- -- 100 %   Constitutional: Well-developed, well-nourished female in no acute distress.  Cardiovascular: normal rate and rhythm Respiratory: normal effort, clear to auscultation bilaterally GI: Abd soft, non-tender, gravid appropriate for gestational age.   No rebound or guarding. MS: Extremities nontender, no edema, normal ROM Neurologic: Alert and oriented x 4.  GU: Neg CVAT. Active vaginal bleeding with dripping down patients legs. One pad soaked on arrival.   FHT:  Baseline 125, moderate variability, accelerations present, no decelerations Contractions: q47min   Labs: No results found for this or any previous visit (from the past 24 hours). --/--/O POS (12/21 2035)  Imaging:  [place] MFM OB FOLLOW UP Result Date: 02/20/2023 ----------------------------------------------------------------------  OBSTETRICS REPORT                       (Signed Final 02/20/2023 01:27 pm) ---------------------------------------------------------------------- Patient Info  ID #:       [id number]  D.O.B.:  1995-05-25 (27 yrs)(F)  Name:       Erica Horton                 Visit Date: 02/20/2023 09:43 am ---------------------------------------------------------------------- Performed By  Attending:        Noralee Space MD        Referred By:      [facility]                                                             Care  Performed By:     Emeline Darling BS,      [facility] ---------------------------------------------------------------------- Orders  #  Description                           Code        Ordered By  1  [place] MFM OB FOLLOW UP                   76816.01    Mariel Aloe  2  [place] MFM FETAL  BPP WO NON               76819.01    Mariel Aloe     STRESS ----------------------------------------------------------------------  #  Order #                     Accession #                Episode #  1  [id number]                   [id number]                 [id number]  2  [id number]                   [id number]                 [id number] ---------------------------------------------------------------------- Indications  Placenta previa with hemorrhage, third         O44.13  trimester  Encounter for other antenatal screening        Z36.2  follow-up  Late to prenatal care, third trimester         O09.33  Low Risk NIPS, Negative Horizon  [redacted] weeks gestation of pregnancy                Z3A.34 ---------------------------------------------------------------------- Fetal Evaluation  Num Of Fetuses:         1  Fetal Heart Rate(bpm):  149  Cardiac Activity:       Observed  Presentation:           Cephalic  Placenta:               Posterior previa  P. Cord Insertion:  Previously seen  Amniotic Fluid  AFI FV:      Within normal limits  AFI Sum(cm)     %Tile       Largest Pocket(cm)  16.8            61          6.8  RUQ(cm)       RLQ(cm)       LUQ(cm)        LLQ(cm)  6.8           3.9           3              3.1  Comment:    No placental abruption identified. ---------------------------------------------------------------------- Biophysical Evaluation  Amniotic F.V:   Pocket => 2 cm             F. Tone:        Observed  F. Movement:    Observed                   Score:          8/8  F. Breathing:   Observed ---------------------------------------------------------------------- Biometry  BPD:      80.8  mm     G. Age:  32w 3d          5  %    CI:        71.21   %    70 - 86                                                          FL/HC:      22.9   %    20.1 - 22.3  HC:       305   mm     G. Age:  34w 0d          8  %    HC/AC:      0.96        0.93 - 1.11  AC:      316.4  mm     G. Age:  35w 4d         82  %    FL/BPD:      86.4   %    71 - 87  FL:       69.8  mm     G. Age:  35w 6d         74  %    FL/AC:      22.1   %    20 - 24  Est. FW:    2596  gm    5 lb 12 oz      62  % ---------------------------------------------------------------------- OB History  Gravidity:    5         Term:   3        Prem:   1 ---------------------------------------------------------------------- Gestational Age  Clinical EDD:  34w 4d                                        EDD:   03/30/23  U/S Today:     34w 3d  EDD:   03/31/23  Best:          34w 4d     Det. By:  Clinical EDD             EDD:   03/30/23 ---------------------------------------------------------------------- Anatomy  Cranium:               Appears normal         Aortic Arch:            Not well visualized  Cavum:                 Appears normal         Ductal Arch:            Not well visualized  Ventricles:            Appears normal         Diaphragm:              Appears normal  Choroid Plexus:        Previously seen        Stomach:                Appears normal, left                                                                        sided  Cerebellum:            Appears normal         Abdomen:                Appears normal  Posterior Fossa:       Appears normal         Abdominal Wall:         Previously seen  Nuchal Fold:           Not applicable (>20    Cord Vessels:           Previously seen                         wks GA)  Face:                  Orbits and profile     Kidneys:                Appear normal                         previously seen  Lips:                  Previously seen        Bladder:                Appears normal  Thoracic:              Appears normal         Spine:                  Previously seen  Heart:                 Appears normal         Upper  Extremities:      Not well visualized                         (4CH, axis, and                         situs)  RVOT:                  Appears normal         Lower Extremities:       Not well visualized  LVOT:                  Appears normal  Other:  Technically difficult due to advanced gestational age. ---------------------------------------------------------------------- Cervix Uterus Adnexa  Cervix  Visualized ---------------------------------------------------------------------- Impression  Patient is admitted with vaginal bleeding and placenta previa  was confirmed on ultrasound.  Fetal growth is appropriate for gestational age. Amniotic fluid  is normal and good fetal activity is seen. Antenatal testing is  reassuring. BPP 8/8.  On transabdominal scan, placenta previa is seen again. ---------------------------------------------------------------------- Recommendations  -Consider delivery at 36 weeks if patient has intermittent or  mild vaginal bleeding.  -If patient has severe vaginal bleeding, she should be  delivered at any gestational age.  -Antenatal corticosteroids after discussing with the patient. ----------------------------------------------------------------------                  Noralee Space, MD Electronically Signed Final Report   02/20/2023 01:27 pm ----------------------------------------------------------------------   [place] MFM FETAL BPP WO NON STRESS Result Date: 02/20/2023 ----------------------------------------------------------------------  OBSTETRICS REPORT                       (Signed Final 02/20/2023 01:27 pm) ---------------------------------------------------------------------- Patient Info  ID #:       [id number]                          D.O.B.:  1995-06-01 (27 yrs)(F)  Name:       Erica Horton                 Visit Date: 02/20/2023 09:43 am ---------------------------------------------------------------------- Performed By  Attending:        Noralee Space MD        Referred By:      [facility]                                                             Care  Performed By:     Emeline Darling BS,      [facility] ---------------------------------------------------------------------- Orders  #  Description                           Code  Ordered By  1  [place] MFM OB FOLLOW UP                   [id number]    Mariel Aloe  2  [place] MFM FETAL BPP WO NON               76819.01    Mariel Aloe     STRESS ----------------------------------------------------------------------  #  Order #                     Accession #                Episode #  1  [id number]                   [id number]                 [id number]  2  [id number]                   [id number]                 [id number] ---------------------------------------------------------------------- Indications  Placenta previa with hemorrhage, third         O44.13  trimester  Encounter for other antenatal screening        Z36.2  follow-up  Late to prenatal care, third trimester         O09.33  Low Risk NIPS, Negative Horizon  [redacted] weeks gestation of pregnancy                Z3A.34 ---------------------------------------------------------------------- Fetal Evaluation  Num Of Fetuses:         1  Fetal Heart Rate(bpm):  149  Cardiac Activity:       Observed  Presentation:           Cephalic  Placenta:               Posterior previa  P. Cord Insertion:      Previously seen  Amniotic Fluid  AFI FV:      Within normal limits  AFI Sum(cm)     %Tile       Largest Pocket(cm)  16.8            61          6.8  RUQ(cm)       RLQ(cm)       LUQ(cm)        LLQ(cm)  6.8           3.9           3              3.1  Comment:    No placental abruption identified. ---------------------------------------------------------------------- Biophysical Evaluation  Amniotic F.V:   Pocket => 2 cm             F. Tone:        Observed  F. Movement:    Observed                   Score:          8/8  F. Breathing:   Observed ---------------------------------------------------------------------- Biometry  BPD:      80.8  mm     G. Age:  32w 3d          5  %    CI:         71.21   %    70 -  86                                                          FL/HC:      22.9   %    20.1 - 22.3  HC:       305   mm     G. Age:  34w 0d          8  %    HC/AC:      0.96        0.93 - 1.11  AC:      316.4  mm     G. Age:  35w 4d         82  %    FL/BPD:     86.4   %    71 - 87  FL:       69.8  mm     G. Age:  35w 6d         74  %    FL/AC:      22.1   %    20 - 24  Est. FW:    2596  gm    5 lb 12 oz      62  % ---------------------------------------------------------------------- OB History  Gravidity:    5         Term:   3        Prem:   1 ---------------------------------------------------------------------- Gestational Age  Clinical EDD:  34w 4d                                        EDD:   03/30/23  U/S Today:     34w 3d                                        EDD:   03/31/23  Best:          34w 4d     Det. By:  Clinical EDD             EDD:   03/30/23 ---------------------------------------------------------------------- Anatomy  Cranium:               Appears normal         Aortic Arch:            Not well visualized  Cavum:                 Appears normal         Ductal Arch:            Not well visualized  Ventricles:            Appears normal         Diaphragm:              Appears normal  Choroid Plexus:        Previously seen        Stomach:                Appears normal, left  sided  Cerebellum:            Appears normal         Abdomen:                Appears normal  Posterior Fossa:       Appears normal         Abdominal Wall:         Previously seen  Nuchal Fold:           Not applicable (>20    Cord Vessels:           Previously seen                         wks GA)  Face:                  Orbits and profile     Kidneys:                Appear normal                         previously seen  Lips:                  Previously seen        Bladder:                Appears normal  Thoracic:              Appears normal          Spine:                  Previously seen  Heart:                 Appears normal         Upper Extremities:      Not well visualized                         (4CH, axis, and                         situs)  RVOT:                  Appears normal         Lower Extremities:      Not well visualized  LVOT:                  Appears normal  Other:  Technically difficult due to advanced gestational age. ---------------------------------------------------------------------- Cervix Uterus Adnexa  Cervix  Visualized ---------------------------------------------------------------------- Impression  Patient is admitted with vaginal bleeding and placenta previa  was confirmed on ultrasound.  Fetal growth is appropriate for gestational age. Amniotic fluid  is normal and good fetal activity is seen. Antenatal testing is  reassuring. BPP 8/8.  On transabdominal scan, placenta previa is seen again. ---------------------------------------------------------------------- Recommendations  -Consider delivery at 36 weeks if patient has intermittent or  mild vaginal bleeding.  -If patient has severe vaginal bleeding, she should be  delivered at any gestational age.  -Antenatal corticosteroids after discussing with the patient. ----------------------------------------------------------------------                  Noralee Space, MD Electronically Signed Final Report   02/20/2023 01:27 pm ----------------------------------------------------------------------   [place] MFM OB LIMITED Result Date: 02/18/2023 ----------------------------------------------------------------------  OBSTETRICS REPORT                    (  Corrected Final 02/18/2023 10:41 pm) ---------------------------------------------------------------------- Patient Info  ID #:       [id number]                          D.O.B.:  October 27, 1995 (27 yrs)(F)  Name:       Erica Horton                 Visit Date: 02/18/2023 08:38 pm  ---------------------------------------------------------------------- Performed By  Attending:        Noralee Space MD        Referred By:       [facility] MAU/Triage  Performed By:     Earley Brooke     [facility] ---------------------------------------------------------------------- Orders  #  Description                           Code        Ordered By  1  [place] MFM FETAL BPP WO NON               76819.01    ELIZABETH DAVIS     STRESS  2  [place] MFM OB LIMITED                     76815.01    ELIZABETH DAVIS  3  [place] MFM OB TRANSVAGINAL                76817.2     ELIZABETH DAVIS ----------------------------------------------------------------------  #  Order #                     Accession #                Episode #  1  [id number]                   [id number]                 [id number]  2  [id number]                   [id number]                 [id number]  3  [id number]                   [id number]                 [id number] ---------------------------------------------------------------------- Indications  Placenta previa with hemorrhage, third          O44.13  trimester  Traumatic injury during pregnancy (Fall)        O9A.219 T14.90  [redacted] weeks gestation of pregnancy                 Z3A.34  Vaginal bleeding in pregnancy, third trimester  O46.93  Non-reactive NST                                O28.9 ----------------------------------------------------------------------  Fetal Evaluation  Num Of Fetuses:          1  Fetal Heart Rate(bpm):   154  Cardiac Activity:        Observed  Presentation:            Cephalic  Placenta:                Posterior previa  P. Cord Insertion:       Visualized  Amniotic Fluid  AFI FV:      Within normal limits  AFI Sum(cm)     %Tile       Largest Pocket(cm)  15.3            55          6  RUQ(cm)       RLQ(cm)       LUQ(cm)        LLQ(cm)  4.4           3.3           1.6            6  Comment:    No  placental abruption identified by Ultrasound. ---------------------------------------------------------------------- Biophysical Evaluation  Amniotic F.V:   Within normal limits       F. Tone:         Observed  F. Movement:    Observed                   Score:           6/8  F. Breathing:   Not Observed ---------------------------------------------------------------------- OB History  Gravidity:    5         Term:   3        Prem:   1 ---------------------------------------------------------------------- Gestational Age  Clinical EDD:  34w 2d                                        EDD:   03/30/23  Best:          34w 2d     Det. By:  Clinical EDD             EDD:   03/30/23 ---------------------------------------------------------------------- Impression  Patient was evaluated for c/o vaginal bleeding.  Amniotic fluid is normal and good fetal activity is seen.  Cephalic presentation. We performed transvaginal ultrasound  and placenta previa is seen. Fetal breathing movements did  not meet the criteria of BPP. Cephalic presentation. BPP 6/8.  Impression: Placenta previa. ---------------------------------------------------------------------- Recommendations  -Fetal growth assessment and BPP on Monday. ----------------------------------------------------------------------                       Noralee Space, MD Electronically Signed Corrected Final Report  02/18/2023 10:41 pm ----------------------------------------------------------------------   [place] MFM FETAL BPP WO NON STRESS Result Date: 02/18/2023 ----------------------------------------------------------------------  OBSTETRICS REPORT                    (Corrected Final 02/18/2023 10:41 pm) ---------------------------------------------------------------------- Patient Info  ID #:       [id number]                          D.O.B.:  1995/09/30 (27 yrs)(F)  Name:       Erica Horton  Visit Date: 02/18/2023 08:38 pm  ---------------------------------------------------------------------- Performed By  Attending:        Noralee Space MD        Referred By:       [facility] MAU/Triage  Performed By:     Earley Brooke     [facility] ---------------------------------------------------------------------- Orders  #  Description                           Code        Ordered By  1  [place] MFM FETAL BPP WO NON               76819.01    ELIZABETH DAVIS     STRESS  2  [place] MFM OB LIMITED                     76815.01    ELIZABETH DAVIS  3  [place] MFM OB TRANSVAGINAL                76817.2     ELIZABETH DAVIS ----------------------------------------------------------------------  #  Order #                     Accession #                Episode #  1  [id number]                   [id number]                 [id number]  2  [id number]                   [id number]                 [id number]  3  [id number]                   [id number]                 [id number] ---------------------------------------------------------------------- Indications  Placenta previa with hemorrhage, third          O44.13  trimester  Traumatic injury during pregnancy (Fall)        O9A.219 T14.90  [redacted] weeks gestation of pregnancy                 Z3A.34  Vaginal bleeding in pregnancy, third trimester  O46.93  Non-reactive NST                                O28.9 ---------------------------------------------------------------------- Fetal Evaluation  Num Of Fetuses:          1  Fetal Heart Rate(bpm):   154  Cardiac Activity:        Observed  Presentation:            Cephalic  Placenta:                Posterior previa  P. Cord Insertion:  Visualized  Amniotic Fluid  AFI FV:      Within normal limits  AFI Sum(cm)     %Tile       Largest Pocket(cm)  15.3            55          6  RUQ(cm)       RLQ(cm)       LUQ(cm)        LLQ(cm)  4.4           3.3           1.6            6  Comment:    No  placental abruption identified by Ultrasound. ---------------------------------------------------------------------- Biophysical Evaluation  Amniotic F.V:   Within normal limits       F. Tone:         Observed  F. Movement:    Observed                   Score:           6/8  F. Breathing:   Not Observed ---------------------------------------------------------------------- OB History  Gravidity:    5         Term:   3        Prem:   1 ---------------------------------------------------------------------- Gestational Age  Clinical EDD:  34w 2d                                        EDD:   03/30/23  Best:          34w 2d     Det. By:  Clinical EDD             EDD:   03/30/23 ---------------------------------------------------------------------- Impression  Patient was evaluated for c/o vaginal bleeding.  Amniotic fluid is normal and good fetal activity is seen.  Cephalic presentation. We performed transvaginal ultrasound  and placenta previa is seen. Fetal breathing movements did  not meet the criteria of BPP. Cephalic presentation. BPP 6/8.  Impression: Placenta previa. ---------------------------------------------------------------------- Recommendations  -Fetal growth assessment and BPP on Monday. ----------------------------------------------------------------------                       Noralee Space, MD Electronically Signed Corrected Final Report  02/18/2023 10:41 pm ----------------------------------------------------------------------   [place] MFM OB Transvaginal Result Date: 02/18/2023 ----------------------------------------------------------------------  OBSTETRICS REPORT                    (Corrected Final 02/18/2023 10:41 pm) ---------------------------------------------------------------------- Patient Info  ID #:       [id number]                          D.O.B.:  [birth year] (27 yrs)(F)  Name:       Erica Horton                 Visit Date: 02/18/2023 08:38 pm  ---------------------------------------------------------------------- Performed By  Attending:        Noralee Space MD        Referred By:       [facility] MAU/Triage  Performed By:     Earley Brooke     [facility] ---------------------------------------------------------------------- Orders  #  Description                           Code        Ordered By  1  [place] MFM FETAL BPP WO NON               76819.01    ELIZABETH DAVIS     STRESS  2  [place] MFM OB LIMITED                     76815.01    ELIZABETH DAVIS  3  [place] MFM OB TRANSVAGINAL                16109.6     [place] [facility] DAVIS ----------------------------------------------------------------------  #  Order #                     Accession #                Episode #  1  [id number]                   [id number]                 [id number]  2  [id number]                   [id number]                 [id number]  3  [id number]                   [id number]                 [id number] ---------------------------------------------------------------------- Indications  Placenta previa with hemorrhage, third          O44.13  trimester  Traumatic injury during pregnancy (Fall)        O9A.219 T14.90  [redacted] weeks gestation of pregnancy                 Z3A.34  Vaginal bleeding in pregnancy, third trimester  O46.93  Non-reactive NST                                O28.9 ---------------------------------------------------------------------- Fetal Evaluation  Num Of Fetuses:          1  Fetal Heart Rate(bpm):   154  Cardiac Activity:        Observed  Presentation:            Cephalic  Placenta:                Posterior previa  P. Cord Insertion:       Visualized  Amniotic Fluid  AFI FV:      Within normal limits  AFI Sum(cm)     %Tile       Largest Pocket(cm)  15.3            55          6  RUQ(cm)       RLQ(cm)       LUQ(cm)        LLQ(cm)  4.4           3.3           1.6            6  Comment:    No  placental abruption identified by Ultrasound. ---------------------------------------------------------------------- Biophysical Evaluation  Amniotic F.V:   Within normal limits       F. Tone:         Observed  F. Movement:    Observed                   Score:           6/8  F. Breathing:   Not Observed ---------------------------------------------------------------------- OB History  Gravidity:    5         Term:   3        Prem:   1 ---------------------------------------------------------------------- Gestational Age  Clinical EDD:  34w 2d                                        EDD:   03/30/23  Best:          34w 2d     Det. By:  Clinical EDD             EDD:   03/30/23 ---------------------------------------------------------------------- Impression  Patient was evaluated for c/o vaginal bleeding.  Amniotic fluid is normal and good fetal activity is seen.  Cephalic presentation. We performed transvaginal ultrasound  and placenta previa is seen. Fetal breathing movements did  not meet the criteria of BPP. Cephalic presentation. BPP 6/8.  Impression: Placenta previa. ---------------------------------------------------------------------- Recommendations  -Fetal growth assessment and BPP on Monday. ----------------------------------------------------------------------                       Noralee Space, MD Electronically Signed Corrected Final Report  02/18/2023 10:41 pm ----------------------------------------------------------------------    MAU Course/MDM: I have reviewed the triage vital signs and the nursing notes.   Pertinent labs & imaging results that were available during my care of the patient were reviewed by me and considered in my medical decision making (see chart for details).  I have reviewed her medical records including past results, notes and treatments.   I have ordered labs and reviewed results.  NST reviewed; reactive  Treatments in MAU included NST, 1L bolus.    Assessment: 1. [redacted]  weeks gestation of pregnancy   2. Placenta previa in third trimester   Recurrent vaginal bleeding at [redacted] weeks gestation, with associated contractions every 6 minutes.   Plan: Admit for CS  Wyn Forster, MD FMOB Fellow, Faculty practice [facility], Center for [facility]  02/26/2023 12:15 PM

## 2023-02-26 NOTE — MAU Note (Signed)
CRITICAL VALUE STICKER  CRITICAL VALUE: hemoglobin: 6.7  RECEIVER (on-site recipient of call): Myliyah Rebuck  DATE & TIME NOTIFIED: 1234  MESSENGER (representative from lab):  MD NOTIFIED: Dr. Debroah Loop   TIME OF NOTIFICATION:1235  RESPONSE:  2 units blood

## 2023-02-26 NOTE — Discharge Summary (Signed)
 Postpartum Discharge Summary     Patient Name: Erica Horton DOB: 12-22-1995 MRN: [id number]  Date of admission: 02/26/2023 Delivery date:02/26/2023 Delivering provider: EVELINE LYNWOOD MATSU Date of discharge: 03/01/2023  Admitting diagnosis: Placenta previa antepartum [O44.00] S/P primary low transverse C-section [Z98.891] Intrauterine pregnancy: [redacted]w[redacted]d     Secondary diagnosis:  Principal Problem:   Placenta previa antepartum Active Problems:   S/P primary low transverse C-section   Acute blood loss anemia  Additional problems: None    Discharge diagnosis: Preterm Pregnancy Delivered, Anemia, and Placenta previa with vaginal bleeding                                               Post partum procedures:blood transfusion Augmentation: N/A Complications: Placental Abruption  Hospital course: Sceduled C/S   27 y.o. yo [id number] at [redacted]w[redacted]d was admitted to the hospital 02/26/2023 for scheduled cesarean section with the following indication:Previa.Delivery details are as follows:  Membrane Rupture Time/Date: 1:51 PM,02/26/2023  Delivery Method:C-Section, Low Transverse Operative Delivery:N/A Details of operation can be found in separate operative note.  Patient had a postpartum course complicated by low HgB, requiring 2u pRBC with good response of HgB.  She is ambulating, tolerating a regular diet, passing flatus, and urinating well. Patient is discharged home in stable condition on  03/01/23        Newborn Data: Birth date:02/26/2023 Birth time:1:52 PM Gender:Female Living status:Living Apgars:9 ,9  Weight:2330 g    Magnesium  Sulfate received: No BMZ received: Yes Rhophylac:N/A MMR:Yes T-DaP:Given prenatally Flu: No RSV Vaccine received: No Transfusion:Yes  Immunizations received: Immunization History  Administered Date(s) Administered   Tdap 02/01/2021    Physical exam  Vitals:   02/28/23 0352 02/28/23 1350 02/28/23 2105 03/01/23 0441  BP: 123/81 127/79 124/74 120/81   Pulse: 71 74 70 76  Resp: 16 16 19 16   Temp: 98.3 F (36.8 C) 98 F (36.7 C) 98.2 F (36.8 C) 98.8 F (37.1 C)  TempSrc:  Oral Oral   SpO2:  99% 99%   Weight:      Height:       General: alert, cooperative, and no distress Lochia: appropriate Uterine Fundus: firm Incision: Dressing is clean, dry, and intact DVT Evaluation: No evidence of DVT seen on physical exam. No significant calf/ankle edema. Labs: Lab Results  Component Value Date   WBC 16.8 (H) 02/28/2023   HGB 7.1 (L) 02/28/2023   HCT 20.9 (L) 02/28/2023   MCV 90.1 02/28/2023   PLT 191 02/28/2023      Latest Ref Rng & Units 02/26/2023   12:02 PM  CMP  Creatinine 0.44 - 1.00 mg/dL 9.25    Edinburgh Score:    02/28/2023    8:48 AM  Edinburgh Postnatal Depression Scale Screening Tool  I have been able to laugh and see the funny side of things. 1  I have looked forward with enjoyment to things. 0  I have blamed myself unnecessarily when things went wrong. 0  I have been anxious or worried for no good reason. 0  I have felt scared or panicky for no good reason. 0  Things have been getting on top of me. 0  I have been so unhappy that I have had difficulty sleeping. 0  I have felt sad or miserable. 0  I have been so unhappy that I have been crying. 0  The thought of harming myself has occurred to me. 0  Edinburgh Postnatal Depression Scale Total 1   Edinburgh Postnatal Depression Scale Total: 1   After visit meds:  Allergies as of 03/01/2023       Reactions   Coconut (cocos Nucifera) Rash        Medication List     TAKE these medications    ibuprofen  600 MG tablet Commonly known as: ADVIL  Take 1 tablet (600 mg total) by mouth every 6 (six) hours.   oxyCODONE  5 MG immediate release tablet Commonly known as: Oxy IR/ROXICODONE  Take 1 tablet (5 mg total) by mouth every 6 (six) hours as needed for severe pain (pain score 7-10) or breakthrough pain.   PrePLUS 27-1 MG Tabs Take 1 tablet by mouth  daily.   senna-docusate 8.6-50 MG tablet Commonly known as: Senokot-S Take 2 tablets by mouth at bedtime.         Discharge home in stable condition Infant Feeding: Bottle and Breast Infant Disposition:home with mother Discharge instruction: per After Visit Summary and Postpartum booklet. Activity: Advance as tolerated. Pelvic rest for 6 weeks.  Diet: routine diet Future Appointments: Future Appointments  Date Time Provider Department Center  03/02/2023 10:55 AM Vannie Cornell SAUNDERS, CNM [facility]   Follow up Visit:  Follow-up Information     [facility] Follow up in 1 week(s).   Specialty: Obstetrics and Gynecology Contact information: [address] [place]  [address] [phone]               Message sent to [facility] on 12/29 by Dr. Loyola  Please schedule this patient for a In person postpartum visit in 4 weeks with the following provider: Any provider. Additional Postpartum F/U:Postpartum Depression checkup and Incision check 1 week  High risk pregnancy complicated by:  Placenta previa Delivery mode:  C-Section, Low Transverse Anticipated Birth Control:  Unsure   03/01/2023 Alain Sor, MD

## 2023-02-26 NOTE — Anesthesia Procedure Notes (Signed)
Spinal  Patient location during procedure: OR Start time: 02/26/2023 1:25 PM End time: 02/26/2023 1:30 PM Reason for block: surgical anesthesia Staffing Performed: anesthesiologist  Anesthesiologist: Val Eagle, MD Performed by: Val Eagle, MD Authorized by: Val Eagle, MD   Preanesthetic Checklist Completed: patient identified, IV checked, risks and benefits discussed, surgical consent, monitors and equipment checked, pre-op evaluation and timeout performed Spinal Block Patient position: sitting Prep: DuraPrep Patient monitoring: heart rate, cardiac monitor, continuous pulse ox and blood pressure Approach: midline Location: L3-4 Injection technique: single-shot Needle Needle type: Pencan  Needle gauge: 24 G Needle length: 9 cm Assessment Sensory level: T6 Events: CSF return

## 2023-02-26 NOTE — Transfer of Care (Signed)
Immediate Anesthesia Transfer of Care Note  Patient: Erica Horton  Procedure(s) Performed: CESAREAN SECTION  Patient Location: PACU  Anesthesia Type:Spinal  Level of Consciousness: awake  Airway & Oxygen Therapy: Patient Spontanous Breathing  Post-op Assessment: Report given to RN  Post vital signs: Reviewed and stable  Last Vitals:  Vitals Value Taken Time  BP 121/69 02/26/23 1451  Temp 36 C 02/26/23 1450  Pulse 73 02/26/23 1456  Resp 13 02/26/23 1456  SpO2 100 % 02/26/23 1456  Vitals shown include unfiled device data.  Last Pain:  Vitals:   02/26/23 1450  TempSrc: Temporal  PainSc:          Complications: No notable events documented.

## 2023-02-26 NOTE — MAU Note (Signed)
Pt reports she started bleeding about 30 min ago.  Bleeding is bright red and moderate.  Denies recent intercourse. +FM, Meda Klinefelter LOF

## 2023-02-26 NOTE — Lactation Note (Signed)
This note was copied from a baby's chart. Lactation Consultation Note  Patient Name: Erica Horton ZOXWR'U Date: 02/26/2023 Age:26 hours Reason for consult: Initial assessment;Late-preterm 34-36.6wks;Infant < 6lbs;Breastfeeding assistance  P4- Infant was born at [redacted]w[redacted]d weighing 2330g. Infant had an initial glucose of 33. LPI/low birth weight feeding plan set in place for infant.  Feeding plan as follows: -Entire feeding may not exceed 30 minutes total; breast 15 min max and bottle 15 min max. -Supplement with every feeding; day 1 with a minimum of 10-12 mL, day 2 with 15-18 mL and day 3 with 20-24 mL. -Pump after every feeding session to ensure proper breast stimulation. -Use white Nfant slow flow nipple until speech says otherwise.  MOB verbalized agreement and understanding of feeding plan. MOB had already been provided with Similac Neosure 22 kcal to supplement infant with. MOB informed LC that she refuses to feed infant this type of formula and someone is bringing her a special type of Enfamil tomorrow. LC informed RN of this. LC still encouraged supplementation fro tonight, so DBM was discussed. MOB consented to [facility] and signed the form with LC present in room. Resuce bottles of DBM was brought into MOB's room, scanned into infant's flowsheet and placed in the fridge.  MOB requested LC's assistance with latching infant because she can not feel her legs well. LC placed infant on the left breast in the football hold (as requested by MOB). Infant latched immediately and had a strong rhythmic suck. Infant needed some stimulation to continue sucking, but not much. Infant nursed for 20 minutes total.  LC set up the DEBP with size 27 mm flanges. LC reviewed how to use the pump, but MOB did not pump at this time due to having multiple visitors. MOB requested 27 mm flanges. LC was not able to size them at this time, but by looking at MOB's nipples a 24 mm or 27 mm flange would be appropriate.  LC  reviewed feeding infant on cue 8-12x in 24 hrs, not allowing infant to go over 3 hrs without a feeding, CDC milk storage guidelines, LC services handout and LPI/low birth weight feeding guidelines. LC encouraged MOB to call for further assistance as needed.  Maternal Data Has patient been taught Hand Expression?: No Does the patient have breastfeeding experience prior to this delivery?: Yes How long did the patient breastfeed?: 3 months with first child, 1 year with second child and 9-10 months with third child  Feeding Mother's Current Feeding Choice: Breast Milk and Donor Milk  LATCH Score Latch: Grasps breast easily, tongue down, lips flanged, rhythmical sucking.  Audible Swallowing: A few with stimulation  Type of Nipple: Everted at rest and after stimulation  Comfort (Breast/Nipple): Soft / non-tender  Hold (Positioning): Assistance needed to correctly position infant at breast and maintain latch.  LATCH Score: 8   Lactation Tools Discussed/Used Tools: Pump;Flanges Flange Size: 27 (MOB requested 27 mm flanges. MOB did not see her pump at this time, but seeing MOB's nipple LC believes she is either 24 mm flange or 27 mm flange.) Breast pump type: Double-Electric Breast Pump;Manual Pump Education: Setup, frequency, and cleaning;Milk Storage Reason for Pumping: LPI/low birth weight feeding guidelines Pumping frequency: 15-20 min every 2-3 hrs  Interventions Interventions: Breast feeding basics reviewed;Assisted with latch;Breast compression;Adjust position;Support pillows;Position options;Hand pump;DEBP;Education;Pace feeding;LC Services brochure;LPT handout/interventions  Discharge Discharge Education: Warning signs for feeding baby WIC Program: Yes  Consult Status Consult Status: Follow-up Date: 02/27/23 Follow-up type: In-patient    Amil Amen  Lundon Rosier BS, IBCLC 02/26/2023, 7:02 PM

## 2023-02-26 NOTE — Op Note (Signed)
Erica Horton PROCEDURE DATE: 02/26/2023  PREOPERATIVE DIAGNOSES: Intrauterine pregnancy at [redacted]w[redacted]d weeks gestation; abruptio placenta and placenta previa with vaginal bleeding  POSTOPERATIVE DIAGNOSES: The same  PROCEDURE: Primary Low Transverse Cesarean Section  SURGEON:  Dr. Scheryl Darter  ASSISTANT:  Dr. Wyn Forster An experienced assistant was required given the standard of surgical care given the complexity of the case.  This assistant was needed for exposure, dissection, suctioning, retraction, instrument exchange, and for overall help during the procedure.   ANESTHESIOLOGY TEAM: Anesthesiologist: Val Eagle, MD CRNA: Algis Greenhouse, CRNA  INDICATIONS: Erica Horton is a 27 y.o. [phone] at [redacted]w[redacted]d here for cesarean section secondary to the indications listed under preoperative diagnoses; please see preoperative note for further details.  The risks of surgery were discussed with the patient including but were not limited to: bleeding which may require transfusion or reoperation; infection which may require antibiotics; injury to bowel, bladder, ureters or other surrounding organs; injury to the fetus; need for additional procedures including hysterectomy in the event of a life-threatening hemorrhage; formation of adhesions; placental abnormalities wth subsequent pregnancies; incisional problems; thromboembolic phenomenon and other postoperative/anesthesia complications.  The patient concurred with the proposed plan, giving informed written consent for the procedure.    FINDINGS:  Viable female infant in cephalic presentation.  Apgars 9 and 9.  Amniotic fluid: clear.  Intact placenta, three vessel cord.  Normal uterus, fallopian tubes and ovaries bilaterally. Large clot on placenta.  ANESTHESIA: spinal INTRAVENOUS FLUIDS: 1650 ml   ESTIMATED BLOOD LOSS: 630 ml URINE OUTPUT:  200 ml SPECIMENS: Placenta sent to pathology  COMPLICATIONS: Starting Hb 6.7, two units pRBC and TXA  given in the OR  PROCEDURE IN DETAIL:  The patient preoperatively received intravenous antibiotics and had sequential compression devices applied to her lower extremities.  She was then taken to the operating room where spinal anesthesia was found to be adequate. She was then placed in a dorsal supine position with a leftward tilt, and prepped and draped in a sterile manner.  A foley catheter was  placed into her bladder and attached to constant gravity.  After an adequate timeout was performed, a Pfannenstiel skin incision was made with scalpel and carried through to the underlying layer of fascia. The fascia was incised in the midline, and this incision was extended bluntly. The rectus muscles were separated in the midline and the peritoneum was entered bluntly.   The Alexis self-retaining retractor was introduced into the abdominal cavity.  Attention was turned to the lower uterine segment where a low transverse hysterotomy was made with a scalpel and extended bilaterally bluntly.  The infant was successfully delivered, the cord was clamped and cut after one minute, and the infant was handed over to the awaiting neonatology team. Uterine massage was then administered, and the placenta delivered intact with a three-vessel cord. The uterus was then cleared of clots and debris.  The hysterotomy was closed with 0 Vicryl in a running fashion. Figure-of-eight 0 Vicryl right lateral corner stitches were placed to help with hemostasis.    The pelvis was cleared of all clot and debris. Hemostasis was confirmed on all surfaces.  The retractor was removed.  The peritoneum was closed with a 2-0 Vicryl running stitch. The fascia was then closed using 0 Vicryl in a running fashion.  The subcutaneous layer was irrigated, and was found to be hemostatic and any areas of bleeding were cauterized with the bovie. The skin was closed with a 4-0 monocryl subcuticular  stitch. The patient tolerated the procedure well. Sponge,  instrument and needle counts were correct x 3.  She was taken to the recovery room in stable condition.   Wyn Forster, MD FMOB Fellow, Faculty practice [facility], Center for [facility]

## 2023-02-27 ENCOUNTER — Encounter (HOSPITAL_COMMUNITY): Payer: Self-pay | Admitting: Obstetrics & Gynecology

## 2023-02-27 DIAGNOSIS — D62 Acute posthemorrhagic anemia: Secondary | ICD-10-CM | POA: Diagnosis not present

## 2023-02-27 LAB — CBC
HCT: 19.7 % — ABNORMAL LOW (ref 36.0–46.0)
HCT: 21.2 % — ABNORMAL LOW (ref 36.0–46.0)
Hemoglobin: 6.6 g/dL — CL (ref 12.0–15.0)
Hemoglobin: 7.2 g/dL — ABNORMAL LOW (ref 12.0–15.0)
MCH: 30 pg (ref 26.0–34.0)
MCH: 30.5 pg (ref 26.0–34.0)
MCHC: 33.5 g/dL (ref 30.0–36.0)
MCHC: 34 g/dL (ref 30.0–36.0)
MCV: 89.5 fL (ref 80.0–100.0)
MCV: 89.8 fL (ref 80.0–100.0)
Platelets: 199 10*3/uL (ref 150–400)
Platelets: 200 10*3/uL (ref 150–400)
RBC: 2.2 MIL/uL — ABNORMAL LOW (ref 3.87–5.11)
RBC: 2.36 MIL/uL — ABNORMAL LOW (ref 3.87–5.11)
RDW: 15.5 % (ref 11.5–15.5)
RDW: 15.2 % (ref 11.5–15.5)
WBC: 19.5 10*3/uL — ABNORMAL HIGH (ref 4.0–10.5)
WBC: 17 10*3/uL — ABNORMAL HIGH (ref 4.0–10.5)
nRBC: 0 % (ref 0.0–0.2)
nRBC: 0.2 % (ref 0.0–0.2)

## 2023-02-27 LAB — PREPARE RBC (CROSSMATCH)

## 2023-02-27 LAB — RPR: RPR Ser Ql: NONREACTIVE

## 2023-02-27 LAB — HIV ANTIBODY (ROUTINE TESTING W REFLEX): HIV Screen 4th Generation wRfx: NONREACTIVE

## 2023-02-27 MED ORDER — SODIUM CHLORIDE 0.9% IV SOLUTION
Freq: Once | INTRAVENOUS | Status: AC
Start: 2023-02-27 — End: 2023-02-27

## 2023-02-27 NOTE — Progress Notes (Signed)
Ricke Hey CNM notified of hemaglobin of 6.6 on secure chat and message was seen.

## 2023-02-27 NOTE — Lactation Note (Addendum)
This note was copied from a baby's chart. Lactation Consultation Note  Patient Name: Erica Horton ZOXWR'U Date: 02/27/2023 Age:26 hours Reason for consult: Follow-up assessment;Difficult latch;Late-preterm 34-36.6wks;Infant < 6lbs  P4, 35.3 wks, 5 lb 2 oz birth Weight@ 19 hrs of life. Couplet StS. Explored WIC use, Per mom- missed appointment- can't get in till February. No pump @ home- Discussed loaner WIC pump possibility with $30 cash/ Hold- mom receptive- paperwork provided to her to look over/fillout. Referral faxed- called into breastfeeding hotline- left message to get mom scheduled faster. Great thick colostrum- previously pumped- in fridge. Handouts on LC services, milk storage, and pump care shared with mom. Encouraged mom to call for breastfeeding or pumping assist.   Maternal Data Does the patient have breastfeeding experience prior to this delivery?: Yes  Feeding Mother's Current Feeding Choice: Breast Milk and Donor Milk   Lactation Tools Discussed/Used Breast pump type: Double-Electric Breast Pump;Manual (Mom has pumped colostrum in fridge- 1-2 spoonfuls, provided hand pump to take home) Pump Education: Milk Storage;Setup, frequency, and cleaning Reason for Pumping: Breast stimulation  Interventions Interventions: Skin to skin;Expressed milk;Hand pump;DEBP;Education;LC Services brochure;CDC Guidelines for Breast Pump Cleaning (Milk Storage Guidelines)  Discharge Pump:  ([employer] on list/ does not work for [employer]) [employer] Program: Yes (Mom previously had active children in [facility], missed last Appt- here)  Consult Status Consult Status: Follow-up Date: 02/28/23 Follow-up type: In-patient    [facility] 02/27/2023, 9:12 AM

## 2023-02-27 NOTE — Anesthesia Postprocedure Evaluation (Signed)
Anesthesia Post Note  Patient: Erica Horton  Procedure(s) Performed: CESAREAN SECTION     Patient location during evaluation: PACU Anesthesia Type: Spinal Level of consciousness: awake and alert Pain management: pain level controlled Vital Signs Assessment: post-procedure vital signs reviewed and stable Respiratory status: spontaneous breathing, nonlabored ventilation and respiratory function stable Cardiovascular status: stable and blood pressure returned to baseline Postop Assessment: no apparent nausea or vomiting Anesthetic complications: no      Deakon Frix

## 2023-02-27 NOTE — Progress Notes (Signed)
Subjective: Postpartum Day 1: Cesarean Delivery Patient reports incisional pain and tolerating PO.    Objective: Vital signs in last 24 hours: Temp:  [96.8 F (36 C)-98.1 F (36.7 C)] 98 F (36.7 C) (12/30 0740) Pulse Rate:  [66-97] 68 (12/30 0740) Resp:  [14-18] 14 (12/30 0740) BP: (91-131)/(60-79) 114/65 (12/30 0740) SpO2:  [99 %-100 %] 99 % (12/30 0740) Weight:  [77 kg] 77 kg (12/29 1159)  Physical Exam:  General: alert, cooperative, and no distress Lochia: appropriate Uterine Fundus: firm Incision: healing well, no significant drainage DVT Evaluation: No evidence of DVT seen on physical exam.  Recent Labs    02/26/23 1202 02/27/23 0657  HGB 6.7* 6.6*  HCT 21.2* 19.7*    Assessment/Plan: Status post Cesarean section. Doing well postoperatively.  Continue current care. Will transfuse another unit of blood due to acute blood loss anemia, which is clinically significant.  Levie Heritage, DO 02/27/2023, 11:11 AM

## 2023-02-28 ENCOUNTER — Other Ambulatory Visit: Payer: Self-pay

## 2023-02-28 LAB — TYPE AND SCREEN
ABO/RH(D): O POS
Antibody Screen: NEGATIVE
Unit division: 0
Unit division: 0
Unit division: 0

## 2023-02-28 LAB — CBC
HCT: 20.9 % — ABNORMAL LOW (ref 36.0–46.0)
Hemoglobin: 7.1 g/dL — ABNORMAL LOW (ref 12.0–15.0)
MCH: 30.6 pg (ref 26.0–34.0)
MCHC: 34 g/dL (ref 30.0–36.0)
MCV: 90.1 fL (ref 80.0–100.0)
Platelets: 191 10*3/uL (ref 150–400)
RBC: 2.32 MIL/uL — ABNORMAL LOW (ref 3.87–5.11)
RDW: 15.3 % (ref 11.5–15.5)
WBC: 16.8 10*3/uL — ABNORMAL HIGH (ref 4.0–10.5)
nRBC: 0 % (ref 0.0–0.2)

## 2023-02-28 LAB — BPAM RBC
Blood Product Expiration Date: 202501292359
Blood Product Expiration Date: 202501182359
Blood Product Expiration Date: 202501192359
ISSUE DATE / TIME: 202412301143
ISSUE DATE / TIME: 202412291317
ISSUE DATE / TIME: 202412291317
Unit Type and Rh: 5100
Unit Type and Rh: 5100
Unit Type and Rh: 5100

## 2023-02-28 LAB — SURGICAL PATHOLOGY

## 2023-02-28 MED ORDER — OXYCODONE HCL 5 MG PO TABS
5.0000 mg | ORAL_TABLET | ORAL | Status: DC | PRN
  Administered 2023-02-28: 10 mg via ORAL
  Filled 2023-02-28: qty 2

## 2023-02-28 MED ORDER — PREPLUS 27-1 MG PO TABS: 1.0000 | ORAL_TABLET | Freq: Every day | ORAL | Status: DC
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
## 2023-02-28 NOTE — Progress Notes (Signed)
 POD2 cesarean delivery d/t VB in setting of placenta previa  Subjective: no complaints, up ad lib, voiding, and tolerating PO  Objective: Blood pressure 123/81, pulse 71, temperature 98.3 F (36.8 C), resp. rate 16, height 5' 11 (1.803 m), weight 77 kg, SpO2 99%, unknown if currently breastfeeding.  Physical Exam:  General: alert, cooperative, and no distress Lochia: appropriate Uterine Fundus: firm Incision: healing well DVT Evaluation: No evidence of DVT seen on physical exam.  Recent Labs    02/27/23 2039 02/28/23 0619  HGB 7.2* 7.1*  HCT 21.2* 20.9*    Assessment/Plan: Postpartum - Pain is controlled but needing pain medication more frequently. Will increase timing of oxycodone .  - Contraception: Declines at this time - MOF: Both - Rh status: Rh pos - Rubella status: Immune - Dispo: POD3 most likely - Consults: None  Neonatal - Doing well    LOS: 2 days   Vina Solian 02/28/2023, 1:30 PM

## 2023-03-01 LAB — CBC
HCT: 22.4 % — ABNORMAL LOW (ref 36.0–46.0)
Hemoglobin: 7.4 g/dL — ABNORMAL LOW (ref 12.0–15.0)
MCH: 30.1 pg (ref 26.0–34.0)
MCHC: 33 g/dL (ref 30.0–36.0)
MCV: 91.1 fL (ref 80.0–100.0)
Platelets: 256 10*3/uL (ref 150–400)
RBC: 2.46 MIL/uL — ABNORMAL LOW (ref 3.87–5.11)
RDW: 14.7 % (ref 11.5–15.5)
WBC: 13.5 10*3/uL — ABNORMAL HIGH (ref 4.0–10.5)
nRBC: 0 % (ref 0.0–0.2)

## 2023-03-01 MED ORDER — OXYCODONE HCL 5 MG PO TABS: 5.0000 mg | ORAL_TABLET | Freq: Four times a day (QID) | ORAL | 0 refills | Status: DC | PRN

## 2023-03-01 MED ORDER — IBUPROFEN 600 MG PO TABS: 600.0000 mg | ORAL_TABLET | Freq: Four times a day (QID) | ORAL | 0 refills | Status: AC

## 2023-03-01 MED ORDER — BENZOCAINE-MENTHOL 20-0.5 % EX AERO: 1.0000 | INHALATION_SPRAY | Freq: Four times a day (QID) | CUTANEOUS | Status: DC | PRN

## 2023-03-01 MED ORDER — OXYCODONE HCL 5 MG PO TABS: 5.0000 mg | ORAL_TABLET | Freq: Four times a day (QID) | ORAL | 0 refills | Status: AC | PRN

## 2023-03-01 MED ORDER — SENNOSIDES-DOCUSATE SODIUM 8.6-50 MG PO TABS: 2.0000 | ORAL_TABLET | Freq: Every day | ORAL | 0 refills | Status: AC

## 2023-03-01 NOTE — Lactation Note (Signed)
 This note was copied from a baby's chart. Lactation Consultation Note  Patient Name: Erica Horton Unijb'd Date: 03/01/2023 Age:27 hours  Reason for consult: Follow-up assessment;Infant < 6lbs;Late-preterm 34-36.6wks;Breastfeeding assistance  P4, [redacted]w[redacted]d, 4% weight loss   Follow up visit twice to mother's room. Mother states she has been pumping, breastfeeding and giving her expressed milk by Dr. Orlinda Preemie bottle. Mother is pumping large volumes of milk (60 ml). NP has provided powdered milk for mother to fortify her EBM to increase calories when bottle feeding.   While in room, baby was eager to feed. Baby latched well to the breast. Mother quickly applied pump to the other breast. Mother states I pour milk from the other breast and I want to catch it. Baby fed well with audible swallows and did not show signs of distress with mother's let down. Advised to watch baby for feeding tolerance.   Mother reports that [facility] contacted her and she can get a DEBP on Monday 1/6. Mother declined loaner stating she has a single electric, hand pump and baby is breastfeeding well.   Feeding Mother's Current Feeding Choice: Breast Milk and Formula  LATCH Score Latch: Grasps breast easily, tongue down, lips flanged, rhythmical sucking.  Audible Swallowing: Spontaneous and intermittent  Type of Nipple: Everted at rest and after stimulation  Comfort (Breast/Nipple): Soft / non-tender  Hold (Positioning): Assistance needed to correctly position infant at breast and maintain latch. (assisted because mother was starting to pump on the other breast while feeding)  LATCH Score: 9   Lactation Tools Discussed/Used Pumped volume: 60 mL  Interventions Interventions: Education;Assisted with latch;Support pillows  Discharge Pump: Manual;Personal (single pump (Evenflow))  Consult Status Consult Status: Complete Date: 03/01/23    Joshua Rojelio HERO 03/01/2023, 11:31 AM

## 2023-03-01 NOTE — Discharge Instructions (Signed)
 WHAT TO LOOK OUT FOR: Fever of 100.4 or above Mastitis: feels like flu and breasts hurt Infection: increased pain, swelling or redness Blood clots golf ball size or larger Postpartum depression   Congratulations on your newest addition!

## 2023-03-02 ENCOUNTER — Encounter: Payer: Medicaid Other | Admitting: Certified Nurse Midwife

## 2023-03-02 ENCOUNTER — Encounter (HOSPITAL_COMMUNITY)
Admission: AD | Disposition: A | Discharge status: Home / Self Care | Payer: Self-pay | Source: Home / Self Care | Attending: Obstetrics and Gynecology

## 2023-03-02 SURGERY — Surgical Case
Anesthesia: Regional

## 2023-03-09 ENCOUNTER — Telehealth (HOSPITAL_COMMUNITY): Payer: Self-pay | Admitting: *Deleted

## 2023-03-09 ENCOUNTER — Ambulatory Visit: Payer: Self-pay

## 2023-03-09 NOTE — Telephone Encounter (Signed)
 Attempted hospital discharge follow-up call. Left message for patient to return RN call with any questions or concerns. Deforest Hoyles, RN, 03/08/22, [phone]

## 2023-04-04 NOTE — Discharge Summary (Signed)
Patient ID: Erica Horton MRN: [id number] DOB/AGE: [birth year] 28 y.o.  Admit date: 02/18/2023 Discharge date: 04/04/2023  Admission Diagnoses:placenta previa with bleeding  Discharge Diagnoses: same  Prenatal Procedures: ultrasound  Consults: no  Hospital Course:  This is a 28 y.o. [id number] with IUP at [redacted]w[redacted]d admitted for bleeding with placenta previa on 02/18/23.She had a large gush of bright blood while going to the bathroom. She does not remember what happened afterwards. Her SO noted her slumping over the toilet, then trying to get back up and falling in between the toilet seat and the wall. Notes she did not hit her head. She notes having a headache now (frontal) and lower abdominal cramping. Denies leaking clear fluid, urinary symptoms, pain anywhere else. +FM.  S She was observed, fetal heart rate monitoring remained reassuring, and she had no signs/symptoms of continued vaginal beeding.  Marland Kitchen  She was deemed stable for discharge to home with outpatient follow up.  Discharge Exam:   Physical Examination: CONSTITUTIONAL: Well-developed, well-nourished female in no acute distress.  HENT:  Normocephalic, atraumatic, External right and left ear normal. Oropharynx is clear and moist EYES: Conjunctivae and EOM are normal. Pupils are equal, round, and reactive to light. No scleral icterus.  NECK: Normal range of motion, supple, no masses SKIN: Skin is warm and dry. No rash noted. Not diaphoretic. No erythema. No pallor. NEUROLGIC: Alert and oriented to person, place, and time. Normal reflexes, muscle tone coordination. No cranial nerve deficit noted. PSYCHIATRIC: Normal mood and affect. Normal behavior. Normal judgment and thought content. CARDIOVASCULAR: Normal heart rate noted, regular rhythm RESPIRATORY: Effort and breath sounds normal, no problems with respiration noted MUSCULOSKELETAL: Normal range of motion. No edema and no tenderness. 2+ distal pulses. ABDOMEN: Soft, nontender,  nondistended, gravid. CERVIX:      Significant Diagnostic Studies:  No results found for this or any previous visit (from the past week).  Discharge Condition: Stable  Disposition: Discharge disposition: 01-Home or Self Care        Discharge Instructions     Discharge patient   Complete by: As directed    Discharge disposition: 01-Home or Self Care   Discharge patient date: 02/25/2023      Allergies as of 02/25/2023       Reactions   Coconut (cocos Nucifera) Rash        Medication List     TAKE these medications    PrePLUS 27-1 MG Tabs Take 1 tablet by mouth daily. Notes to patient: Last dose given 02/25/23 @ 1119        Follow-up Information     Federico Flake, MD. Call in 2 day(s).   Specialty: Obstetrics and Gynecology Why: Cesarean section scheduled 1/2 at 1500 Contact information: [address] [place] [address] [phone]                 Signed: Scheryl Darter M.D. 04/04/2023, 11:17 AM

## 2023-04-05 ENCOUNTER — Ambulatory Visit: Payer: Medicaid Other | Admitting: Certified Nurse Midwife

## 2023-04-05 DIAGNOSIS — Z98891 History of uterine scar from previous surgery: Secondary | ICD-10-CM

## 2023-04-05 DIAGNOSIS — D62 Acute posthemorrhagic anemia: Secondary | ICD-10-CM

## 2023-04-05 NOTE — Progress Notes (Unsigned)
Did not come to visit
